# Patient Record
Sex: Female | Born: 1985 | Race: Black or African American | Hispanic: No | Marital: Single | Smoking: Current every day smoker
Health system: Southern US, Community
[De-identification: ages and names within clinical notes are randomized; demographics above are authoritative.]

## PROBLEM LIST (undated history)

## (undated) DIAGNOSIS — O24419 Gestational diabetes mellitus in pregnancy, unspecified control: Secondary | ICD-10-CM

## (undated) HISTORY — PX: NO PAST SURGERIES: SHX2092

## (undated) HISTORY — DX: Gestational diabetes mellitus in pregnancy, unspecified control: O24.419

---
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
## 2003-03-07 ENCOUNTER — Emergency Department (HOSPITAL_COMMUNITY): Admission: EM | Admit: 2003-03-07 | Discharge: 2003-03-07 | Payer: Self-pay | Admitting: Emergency Medicine

## 2009-07-28 ENCOUNTER — Emergency Department (HOSPITAL_COMMUNITY): Admission: EM | Admit: 2009-07-28 | Discharge: 2009-07-28 | Payer: Self-pay | Admitting: Emergency Medicine

## 2017-03-02 ENCOUNTER — Emergency Department (HOSPITAL_COMMUNITY)
Admission: EM | Admit: 2017-03-02 | Discharge: 2017-03-02 | Disposition: A | Discharge status: Home / Self Care | Payer: Self-pay | Attending: Emergency Medicine | Admitting: Emergency Medicine

## 2017-03-02 ENCOUNTER — Encounter: Payer: Self-pay | Admitting: Emergency Medicine

## 2017-03-02 ENCOUNTER — Emergency Department (HOSPITAL_COMMUNITY): Payer: Self-pay

## 2017-03-02 DIAGNOSIS — S92255A Nondisplaced fracture of navicular [scaphoid] of left foot, initial encounter for closed fracture: Secondary | ICD-10-CM | POA: Insufficient documentation

## 2017-03-02 DIAGNOSIS — W19XXXA Unspecified fall, initial encounter: Secondary | ICD-10-CM

## 2017-03-02 DIAGNOSIS — Y999 Unspecified external cause status: Secondary | ICD-10-CM | POA: Insufficient documentation

## 2017-03-02 DIAGNOSIS — Y92007 Garden or yard of unspecified non-institutional (private) residence as the place of occurrence of the external cause: Secondary | ICD-10-CM | POA: Insufficient documentation

## 2017-03-02 DIAGNOSIS — Y92009 Unspecified place in unspecified non-institutional (private) residence as the place of occurrence of the external cause: Secondary | ICD-10-CM

## 2017-03-02 DIAGNOSIS — Y939 Activity, unspecified: Secondary | ICD-10-CM | POA: Insufficient documentation

## 2017-03-02 DIAGNOSIS — W1789XA Other fall from one level to another, initial encounter: Secondary | ICD-10-CM | POA: Insufficient documentation

## 2017-03-02 LAB — I-STAT CHEM 8, ED
BUN: 8 mg/dL (ref 6–20)
CHLORIDE: 104 mmol/L (ref 101–111)
CREATININE: 0.9 mg/dL (ref 0.44–1.00)
Calcium, Ion: 1.25 mmol/L (ref 1.15–1.40)
Glucose, Bld: 119 mg/dL — ABNORMAL HIGH (ref 65–99)
HCT: 42 % (ref 36.0–46.0)
Hemoglobin: 14.3 g/dL (ref 12.0–15.0)
POTASSIUM: 3.5 mmol/L (ref 3.5–5.1)
Sodium: 141 mmol/L (ref 135–145)
TCO2: 25 mmol/L (ref 22–32)

## 2017-03-02 LAB — I-STAT BETA HCG BLOOD, ED (MC, WL, AP ONLY): I-stat hCG, quantitative: <5 m[IU]/mL (ref <5)

## 2017-03-02 MED ORDER — SIMETHICONE 40 MG/0.6ML PO SUSP
ORAL | Status: AC
  Filled 2017-03-02: qty 30

## 2017-03-02 MED ORDER — HYDROCODONE-ACETAMINOPHEN 5-325 MG PO TABS: 1.0000 | ORAL_TABLET | Freq: Four times a day (QID) | ORAL | 0 refills | Status: DC | PRN

## 2017-03-02 MED ORDER — KETOROLAC TROMETHAMINE 30 MG/ML IJ SOLN
30.0000 mg | Freq: Once | INTRAMUSCULAR | Status: AC
  Administered 2017-03-02: 30 mg via INTRAMUSCULAR
  Filled 2017-03-02: qty 1

## 2017-03-02 MED ORDER — NAPROXEN 250 MG PO TABS: ORAL_TABLET | ORAL | 0 refills | Status: DC

## 2017-03-02 NOTE — Discharge Instructions (Signed)
Elevate your foot. Use ice for comfort and to keep the swelling down. DO NOT PUT ANY WEIGHT ON YOUR LEFT FOOT. Use the crutches when you need to walk. Leave the splint on until you are rechecked by the orthopedist on call, Dr Romeo AppleHarrison. Call his office to make an appointment. Take the medications as prescribed.

## 2017-03-02 NOTE — ED Triage Notes (Signed)
Pt feel off front step and twist ankle about 9pm.

## 2017-03-02 NOTE — ED Provider Notes (Signed)
[facility]EM[facility]Y DEPT Provider Note   CSN: [id number] Arrival date & time: 03/02/17  0146  Time seen 02:05 AM   History   Chief Complaint Chief Complaint  Patient presents with  . Foot Injury    Lt    HPI Vickie Moss is a [age] y.o. female.  HPI  patient states she's having remodeling done at her house and the railing has been taken off her deck. She states tonight about 9 PM she fell off the side of the deck about a foot and a half. She does not know how she landed. But states she had pain in her left foot/ankle. She states she was able to ambulate with pain however she was awakened from sleep with worsening pain this morning. She denies any knee pain. She denies any numbness or tingling in her toes. She denies any other injury.  PCP .none  No past medical history on file.  There are no active problems to display for this patient.   No past surgical history on file.  OB History    No data available       Home Medications    Prior to Admission medications   Medication Sig Start Date End Date Taking? Authorizing Provider  HYDROcodone-acetaminophen (NORCO/VICODIN) 5-325 MG tablet Take 1 tablet by mouth every 6 (six) hours as needed for severe pain. 03/02/17   Devoria AlbeKnapp, Deontra Pereyra, MD  naproxen (NAPROSYN) 250 MG tablet Take 1 po BID with food prn pain 03/02/17   Devoria AlbeKnapp, Lisandro Meggett, MD    Family History No family history on file.  Social History Social History  Substance Use Topics  . Smoking status: Not on file  . Smokeless tobacco: Not on file  . Alcohol use Not on file     Allergies   Patient has no allergy information on record.   Review of Systems Review of Systems  All other systems reviewed and are negative.    Physical Exam Updated Vital Signs BP (!) 130/93 (BP Location: Left Arm)   Pulse 78   Temp 98.9 F (37.2 C) (Oral)   Resp 20   Wt 72.1 kg (159 lb)   LMP 02/28/2017   SpO2 99%   Vital signs normal    Physical Exam  Constitutional: She is oriented to  person, place, and time. She appears well-developed and well-nourished. She appears distressed.  HENT:  Head: Normocephalic and atraumatic.  Right Ear: External ear normal.  Left Ear: External ear normal.  Eyes: Conjunctivae and EOM are normal.  Neck: Normal range of motion.  Cardiovascular: Normal rate.   Pulmonary/Chest: Effort normal. No respiratory distress.  Musculoskeletal: She exhibits edema and tenderness.       Feet:  Patient's left knee is nontender to palpation, she has no tenderness to palpate the tibia or fibula until I get to the level of her ankle. She doesn't appear to have pain when I pressed rectally on the malleoli however when I pressed near inferior and anterior to the medial malleolus that seems to be where her pain is located. There is mild swelling in the area. She has good distal pulses and capillary refill.  Neurological: She is alert and oriented to person, place, and time. No cranial nerve deficit.  Skin: Skin is warm and dry. No rash noted. No erythema. No pallor.  Psychiatric: Her behavior is normal. Thought content normal. Her mood appears anxious.  Nursing note and vitals reviewed.    ED Treatments / Results  Labs (all labs ordered are  listed, but only abnormal results are displayed) Results for orders placed or performed during the hospital encounter of 03/02/17  I-stat Chem 8, ED  Result Value Ref Range   Sodium 141 135 - 145 mmol/L   Potassium 3.5 3.5 - 5.1 mmol/L   Chloride 104 101 - 111 mmol/L   BUN 8 6 - 20 mg/dL   Creatinine, Ser 5.78 0.44 - 1.00 mg/dL   Glucose, Bld 469 (H) 65 - 99 mg/dL   Calcium, Ion 6.29 5.28 - 1.40 mmol/L   TCO2 25 22 - 32 mmol/L   Hemoglobin 14.3 12.0 - 15.0 g/dL   HCT 41.3 24.4 - 01.0 %  I-Stat beta hCG blood, ED  Result Value Ref Range   I-stat hCG, quantitative <5.0 <5 mIU/mL   Comment 3           Laboratory interpretation all normal     EKG  EKG Interpretation None       Radiology Dg Ankle Complete  Left  Result Date: 03/02/2017 CLINICAL DATA:  Larey Seat off of front step and twisted the ankle about 9 p.m. EXAM: LEFT FOOT - COMPLETE 3+ VIEW; LEFT ANKLE COMPLETE - 3+ VIEW COMPARISON:  None. FINDINGS: Comminuted fractures of the lateral aspect of the navicular bone with overlying soft tissue swelling. Left foot and ankle appear otherwise intact. No additional fractures are demonstrated. No dislocation. No radiopaque soft tissue foreign bodies. No destructive or expansile bone lesions. IMPRESSION: Comminuted fractures of the lateral aspect of the navicular bone with overlying soft tissue swelling. Left foot and ankle appear otherwise intact. Electronically Signed   By: Burman Nieves M.D.   On: 03/02/2017 03:15   Dg Foot Complete Left  Result Date: 03/02/2017 CLINICAL DATA:  Larey Seat off of front step and twisted the ankle about 9 p.m. EXAM: LEFT FOOT - COMPLETE 3+ VIEW; LEFT ANKLE COMPLETE - 3+ VIEW COMPARISON:  None. FINDINGS: Comminuted fractures of the lateral aspect of the navicular bone with overlying soft tissue swelling. Left foot and ankle appear otherwise intact. No additional fractures are demonstrated. No dislocation. No radiopaque soft tissue foreign bodies. No destructive or expansile bone lesions. IMPRESSION: Comminuted fractures of the lateral aspect of the navicular bone with overlying soft tissue swelling. Left foot and ankle appear otherwise intact. Electronically Signed   By: Burman Nieves M.D.   On: 03/02/2017 03:15    Procedures Procedures (including critical care time)  Medications Ordered in ED Medications  simethicone (MYLICON) 40 MG/0.6ML suspension (not administered)  ketorolac (TORADOL) 30 MG/ML injection 30 mg (30 mg Intramuscular Given 03/02/17 0230)     Initial Impression / Assessment and Plan / ED Course  I have reviewed the triage vital signs and the nursing notes.  Pertinent labs & imaging results that were available during my care of the patient were reviewed by  me and considered in my medical decision making (see chart for details).     Patient was given Toradol IM for pain. X-rays were ordered. Review of her prior testing shows she has no prior laboratory results in our system. I-STAT 8 was ordered as was a i-STAT beta hCG.  After reviewing patient's x-ray she was placed in a posterior splint and crutches. We looked at her x-ray and discussed her fracture.. I stressed the need to follow-up with orthopedist to make sure this heals well. She was advised that she would need to be nonweightbearing for at least 6 weeks.   Review of the [place] shows no entries  Final Clinical Impressions(s) / ED Diagnoses   Final diagnoses:  Closed nondisplaced fracture of navicular bone of left foot, initial encounter  Fall in home, initial encounter    New Prescriptions New Prescriptions   HYDROCODONE-ACETAMINOPHEN (NORCO/VICODIN) 5-325 MG TABLET    Take 1 tablet by mouth every 6 (six) hours as needed for severe pain.   NAPROXEN (NAPROSYN) 250 MG TABLET    Take 1 po BID with food prn pain    Plan discharge  Devoria Albe, MD, Concha Pyo, MD 03/02/17 [phone]

## 2017-03-03 ENCOUNTER — Encounter: Payer: Self-pay | Admitting: Orthopedic Surgery

## 2017-03-03 ENCOUNTER — Ambulatory Visit (INDEPENDENT_AMBULATORY_CARE_PROVIDER_SITE_OTHER): Payer: Self-pay | Admitting: Orthopedic Surgery

## 2017-03-03 VITALS — BP 125/87 | HR 117

## 2017-03-03 DIAGNOSIS — S92255A Nondisplaced fracture of navicular [scaphoid] of left foot, initial encounter for closed fracture: Secondary | ICD-10-CM

## 2017-03-03 NOTE — Progress Notes (Signed)
Patient ID: Vickie Moss, female   DOB: [birth year], 31 y.o.   MRN: [id number]  Chief Complaint  Patient presents with  . Foot Injury    left foot fracture, DOI 03/02/17    HPI Vickie Moss is a 31 y.o. female.  She was having some work done on her porch she came out to talk to the people and fell and injured her talus. The x-ray was read as lateral navicular  fracture and it's actually a medial navicular nondisplaced fracture. She complains of mild to moderate dull aching pain medial side of her foot with swelling  Review of Systems Review of Systems  Respiratory: Negative.   Cardiovascular: Negative.   Musculoskeletal: Positive for gait problem.  Neurological: Negative for numbness.   (2 MINIMUM)  Medical history negative for hypertension diabetes  No past surgical history on file.  Social History Social History  Substance Use Topics  . Smoking status: Current Every Day Smoker  . Smokeless tobacco: Never Used  . Alcohol use No    Not on File  No outpatient prescriptions have been marked as taking for the 03/03/17 encounter (Office Visit) with Vickki HearingHarrison, Mikeisha Lemonds E, MD.      Physical Exam Physical Exam 1.BP 125/87   Pulse (!) 117   LMP 02/28/2017   2. Gen. appearance. The patient is well-developed and well-nourished, grooming and hygiene are normal. There are no gross congenital abnormalities  3. The patient is alert and oriented to person place and time  4. Mood and affect are normal  5. Ambulation crutches needed   Examination reveals the following: 6. On inspection we find primarily tenderness and swelling medial sided ankle over the navicular  7. With the range of motion of  right ankle is normal  8. Stability tests were normal  anterior drawer  9. Strength tests revealed grade 5 motor strength  10. Skin we find no rash ulceration or erythema  11. Sensation remains intact  12 peripheral pulses and capillary refill intact    MEDICAL DECISION  MAKING:    Data Reviewed Plain films ankle and foot administered at the hospital the ankle is normal, the talus fracture is medial not lateral  Assessment Encounter Diagnosis  Name Primary?  . Closed nondisplaced fracture of navicular bone of left foot, initial encounter Yes    Plan Nonweightbearing for 2 weeks and then weightbearing as tolerated in a [place] until symptomatically healed  Recommend x-ray 4 weeks  Out of work next 4 weeks  [employer] Container[employer]ley Irva Loser 03/03/2017, 4:24 PM

## 2017-03-03 NOTE — Patient Instructions (Signed)
Out of work note for 4 weeks or a graft no weight on the foot for the first 2 weeks and then weightbearing as tolerated in a Cam Walker

## 2017-03-31 ENCOUNTER — Encounter: Payer: Self-pay | Admitting: Orthopedic Surgery

## 2017-03-31 ENCOUNTER — Other Ambulatory Visit: Payer: Self-pay

## 2017-08-07 ENCOUNTER — Encounter (HOSPITAL_COMMUNITY): Payer: Self-pay | Admitting: Emergency Medicine

## 2017-08-07 ENCOUNTER — Other Ambulatory Visit: Payer: Self-pay

## 2017-08-07 ENCOUNTER — Emergency Department (HOSPITAL_COMMUNITY)
Admission: EM | Admit: 2017-08-07 | Discharge: 2017-08-07 | Disposition: A | Discharge status: Home / Self Care | Payer: Self-pay | Attending: Emergency Medicine | Admitting: Emergency Medicine

## 2017-08-07 DIAGNOSIS — R21 Rash and other nonspecific skin eruption: Secondary | ICD-10-CM | POA: Insufficient documentation

## 2017-08-07 DIAGNOSIS — F17210 Nicotine dependence, cigarettes, uncomplicated: Secondary | ICD-10-CM | POA: Insufficient documentation

## 2017-08-07 MED ORDER — FAMOTIDINE 20 MG PO TABS
20.0000 mg | ORAL_TABLET | Freq: Once | ORAL | Status: AC
  Administered 2017-08-07: 20 mg via ORAL
  Filled 2017-08-07: qty 1

## 2017-08-07 MED ORDER — HYDROXYZINE HCL 25 MG PO TABS
25.0000 mg | ORAL_TABLET | Freq: Once | ORAL | Status: AC
Start: 2017-08-07 — End: 2017-08-07
  Administered 2017-08-07: 25 mg via ORAL
  Filled 2017-08-07: qty 1

## 2017-08-07 MED ORDER — DEXAMETHASONE SODIUM PHOSPHATE 10 MG/ML IJ SOLN
10.0000 mg | Freq: Once | INTRAMUSCULAR | Status: AC
  Administered 2017-08-07: 10 mg via INTRAMUSCULAR
  Filled 2017-08-07: qty 1

## 2017-08-07 NOTE — ED Triage Notes (Signed)
Pt reports reaction started this morning, noted to have large swollen and reddened areas to bilateral hips. Does state she switched laundry detergent last week but has worn things that had been washed before today. Denies throat swelling or difficulty breathing.

## 2017-08-07 NOTE — Discharge Instructions (Signed)
Please monitor closely the soaps that you use, the dryer sheets that you use.  Please monitor closely your cushion seats and your mattresses for any mites or insects.  Use Claritin each morning for itching.  Use Benadryl at bedtime.  Please return to the emergency department if any changes in your condition, difficulty with breathing, unusual swelling, changes or concerns.

## 2017-08-07 NOTE — ED Provider Notes (Signed)
[facility] Provider Note   CSN: [id number] Arrival date & time: 08/07/17  1514     History   Chief Complaint Chief Complaint  Patient presents with  . Allergic Reaction    HPI Vickie Moss is a 32 y.o. female.  Patient is a 32 year old female who presents to the emergency department with a complaint of rash/swollen areas on her hips.  Patient states that she has been in her usual state of good health until today when she noticed a large red raised area on her right and left hip bone area, one on the back of her leg, and one on the upper portion of her left shoulder.  The patient states she has not been on any new medications, there is been no new foods.  The patient denies being on any new furniture or different furniture.  She states that she has used some different laundry detergent approximately a week ago, but she is worn things that have been washed in this material 1 or 2 days before today.  She denies any swelling of her face or difficulty with breathing or swallowing.  No other issues noted.  She presents now for assistance with this problem.   The history is provided by the patient.  Allergic Reaction  Presenting symptoms: rash   Presenting symptoms: no wheezing     History reviewed. No pertinent past medical history.  There are no active problems to display for this patient.   History reviewed. No pertinent surgical history.  OB History    No data available       Home Medications    Prior to Admission medications   Medication Sig Start Date End Date Taking? Authorizing Provider  HYDROcodone-acetaminophen (NORCO/VICODIN) 5-325 MG tablet Take 1 tablet by mouth every 6 (six) hours as needed for severe pain. 03/02/17   Devoria AlbeKnapp, Iva, MD  naproxen (NAPROSYN) 250 MG tablet Take 1 po BID with food prn pain 03/02/17   Devoria AlbeKnapp, Iva, MD    Family History History reviewed. No pertinent family history.  Social History Social History   Tobacco  Use  . Smoking status: Current Every Day Smoker    Packs/day: 1.00  . Smokeless tobacco: Never Used  Substance Use Topics  . Alcohol use: Yes  . Drug use: Yes    Types: Cocaine    Comment: 08/06/2017     Allergies   Patient has no known allergies.   Review of Systems Review of Systems  Constitutional: Negative for activity change.       All ROS Neg except as noted in HPI  HENT: Negative for nosebleeds.   Eyes: Negative for photophobia and discharge.  Respiratory: Negative for cough, shortness of breath and wheezing.   Cardiovascular: Negative for chest pain and palpitations.  Gastrointestinal: Negative for abdominal pain and blood in stool.  Genitourinary: Negative for dysuria, frequency and hematuria.  Musculoskeletal: Negative for arthralgias, back pain and neck pain.  Skin: Positive for rash.  Neurological: Negative for dizziness, seizures and speech difficulty.  Psychiatric/Behavioral: Negative for confusion and hallucinations.     Physical Exam Updated Vital Signs BP (!) 161/89 (BP Location: Right Arm)   Pulse 93   Temp 97.8 F (36.6 C) (Oral)   Resp 18   Ht 5' 1.5" (1.562 m)   Wt 72.1 kg (159 lb)   LMP 08/02/2017   SpO2 100%   BMI 29.56 kg/m   Physical Exam  Constitutional: She is oriented to person, place, and time.  She appears well-developed and well-nourished.  Non-toxic appearance.  HENT:  Head: Normocephalic.  Right Ear: Tympanic membrane and external ear normal.  Left Ear: Tympanic membrane and external ear normal.  No facial swelling noted.  The airway is patent.  Speech is understandable.  Eyes: EOM and lids are normal. Pupils are equal, round, and reactive to light.  Neck: Normal range of motion. Neck supple. Carotid bruit is not present.  Cardiovascular: Normal rate, regular rhythm, normal heart sounds, intact distal pulses and normal pulses.  Pulmonary/Chest: Breath sounds normal. No respiratory distress.  Patient speaks in complete sentences  without problem.  There is symmetrical rise and fall of the chest.  No wheezes appreciated.  Abdominal: Soft. Bowel sounds are normal. There is no tenderness. There is no guarding.  Musculoskeletal: Normal range of motion.  Lymphadenopathy:       Head (right side): No submandibular adenopathy present.       Head (left side): No submandibular adenopathy present.    She has no cervical adenopathy.  Neurological: She is alert and oriented to person, place, and time. She has normal strength. No cranial nerve deficit or sensory deficit.  Skin: Skin is warm and dry.  Small hive noted at the left upper shoulder.  Red raised hive area on the pubic bone area right and left.  Psychiatric: She has a normal mood and affect. Her speech is normal.  Nursing note and vitals reviewed.    ED Treatments / Results  Labs (all labs ordered are listed, but only abnormal results are displayed) Labs Reviewed - No data to display  EKG  EKG Interpretation None       Radiology No results found.  Procedures Procedures (including critical care time)  Medications Ordered in ED Medications - No data to display   Initial Impression / Assessment and Plan / ED Course  I have reviewed the triage vital signs and the nursing notes.  Pertinent labs & imaging results that were available during my care of the patient were reviewed by me and considered in my medical decision making (see chart for details).       Final Clinical Impressions(s) / ED Diagnoses MDM  Patient presents to the emergency department with a hive type areas on her upper shoulder area, both hip areas, and one on the back of the left thigh.  There is been no recent changes in medicine or foods.  The patient is not seen anything crawling or biting her.  She states she has not been on any new furniture or different furniture.  She has used some different detergent, but has been wearing closed during the week that is been washed in this  particular detergent with no reaction until now.  I have asked the patient to jot down anything new that she may be eating or drinking.  To monitor her cushion seats as well as her mattresses closely for any bugs or insects of any kind.  Patient will be treated with Claritin during the day, and Benadryl at bedtime.  Patient was given an injection of Decadron here in the emergency department.  She is to return immediately if any changes in her condition, problems, or concerns.  Patient is in agreement with this plan.   Final diagnoses:  Rash    ED Discharge Orders    None       Ivery Quale, PA-C 08/07/17 1802    Derwood Kaplan, MD 08/08/17 [phone]

## 2018-02-12 IMAGING — DX DG ANKLE COMPLETE 3+V*L*
3 series · 3 of 3 positions shown · non-contrast
Comparison: None.

CLINICAL DATA: Fell off of front step and twisted the ankle about 9
p.m..

EXAM:
LEFT FOOT - COMPLETE 3+ VIEW; LEFT ANKLE COMPLETE - 3+ VIEW

[ankle ap]
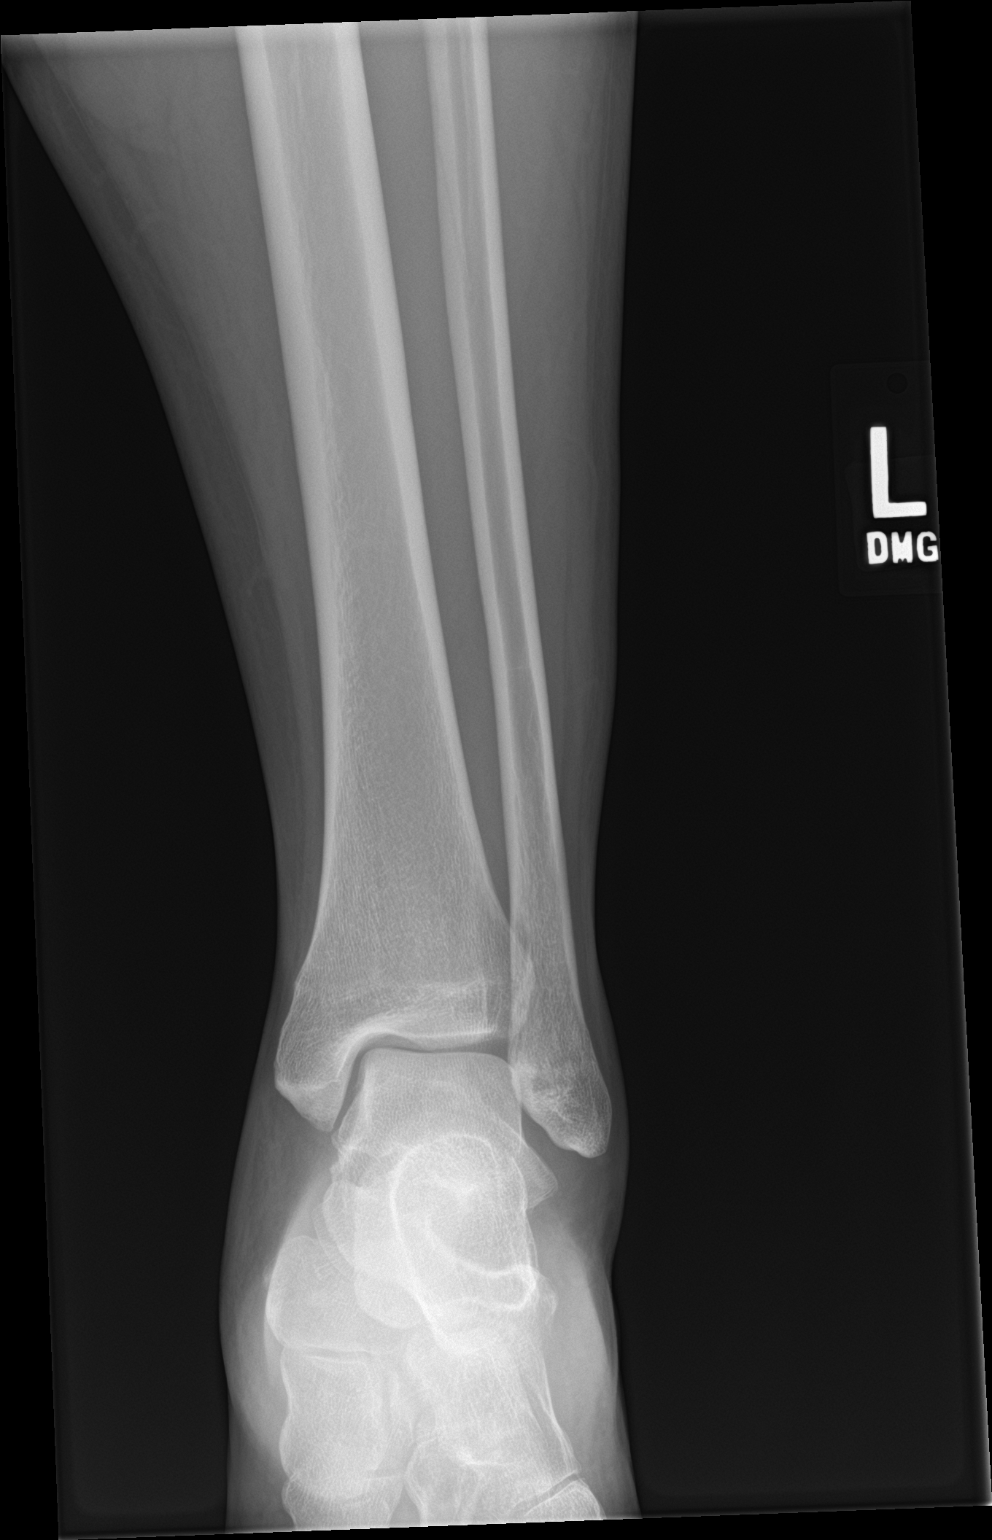

[ankle obl]
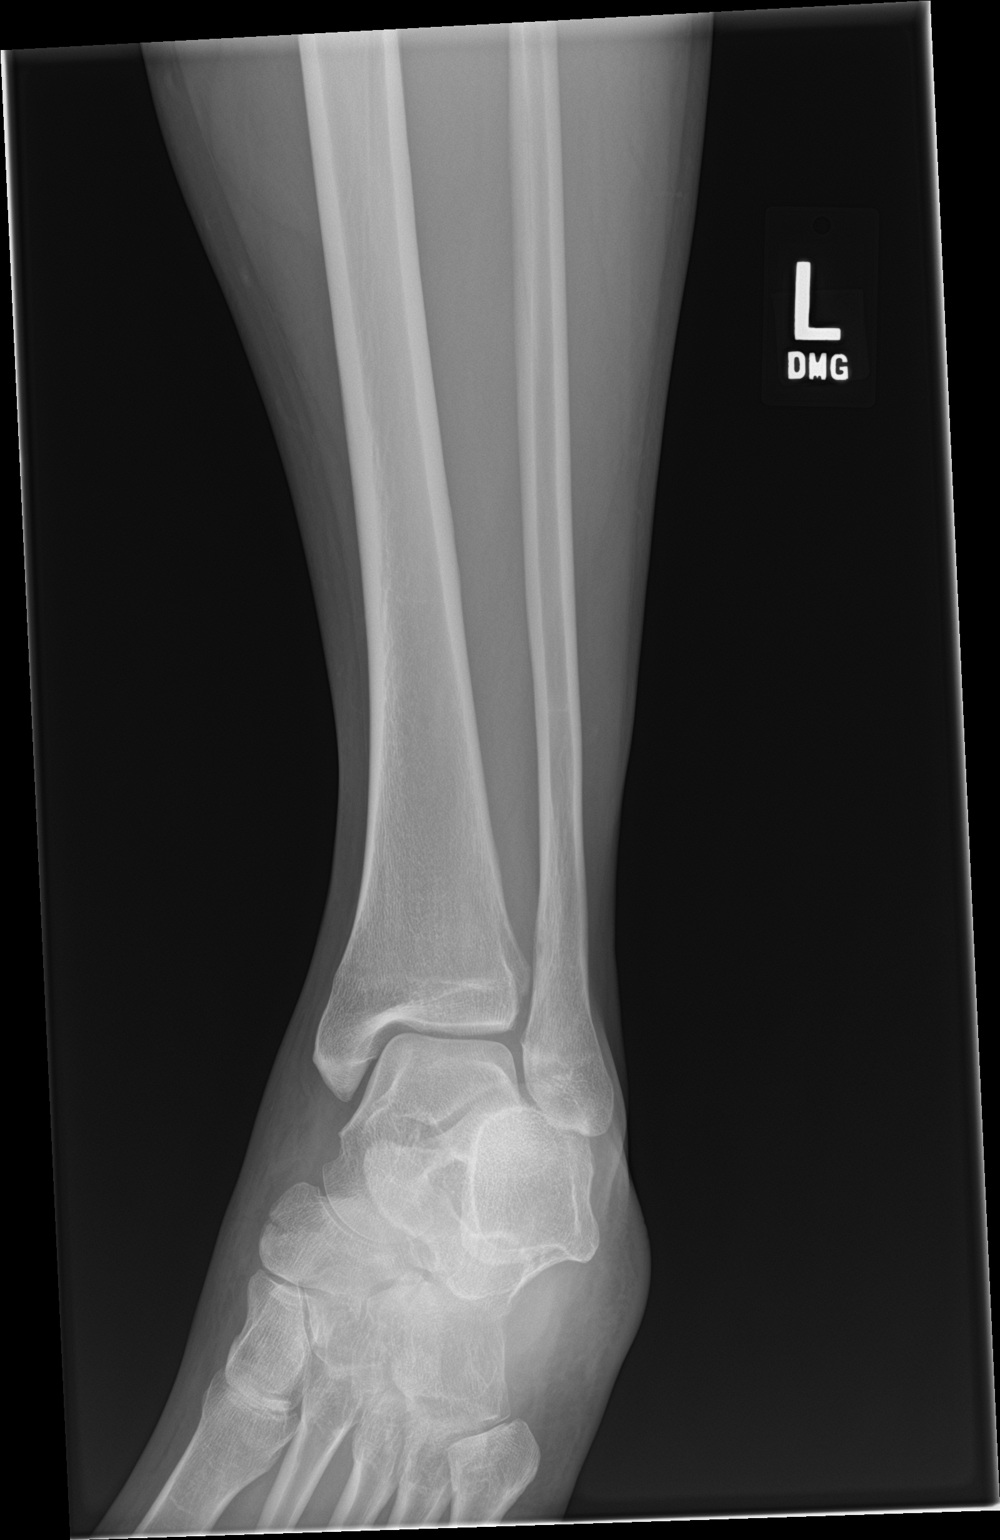

[ankle lat]
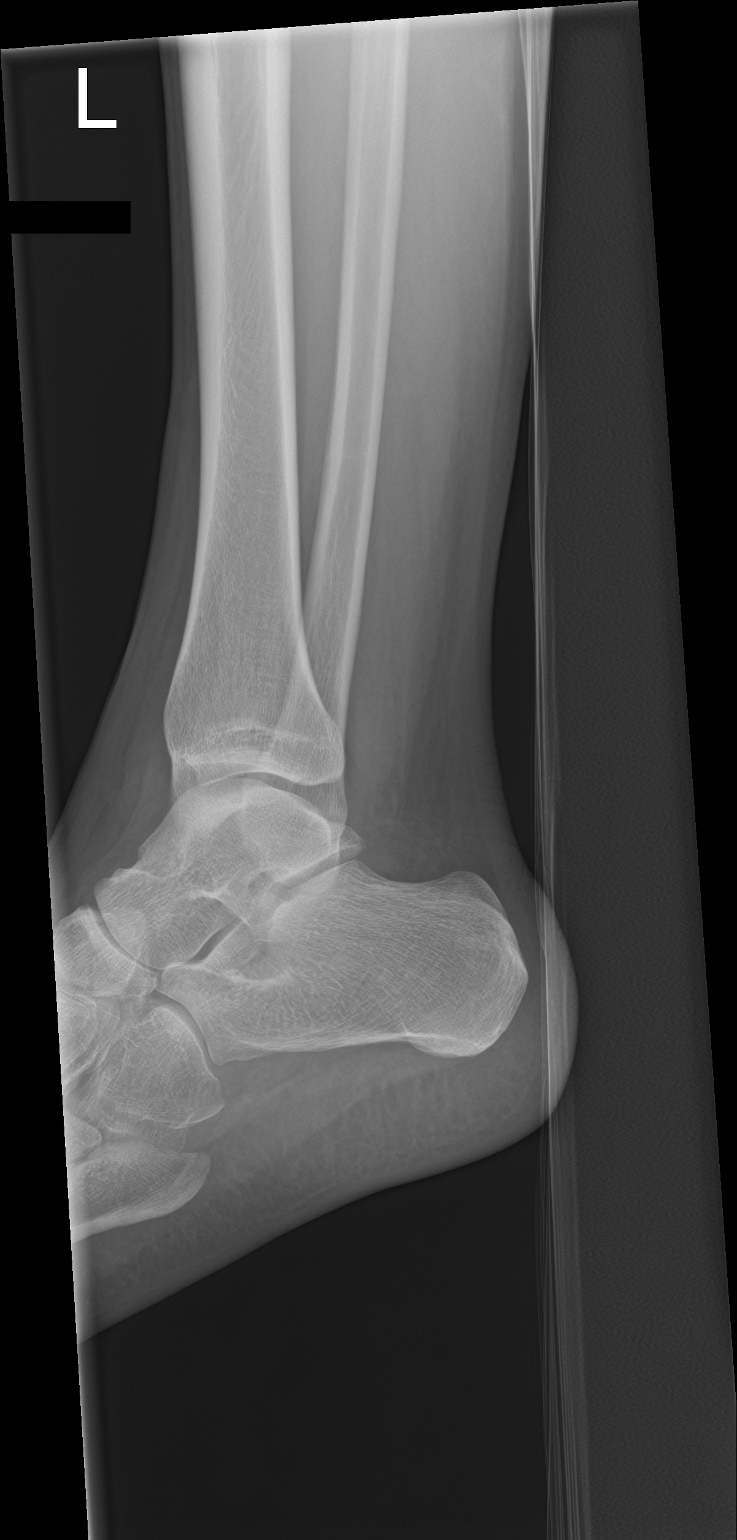

[3 of 3 positions shown; findings below may reference images not displayed]

FINDINGS: Comminuted fractures of the lateral aspect of the navicular bone
with overlying soft tissue swelling. Left foot and ankle appear
otherwise intact. No additional fractures are demonstrated. No
dislocation. No radiopaque soft tissue foreign bodies. No
destructive or expansile bone lesions.
IMPRESSION: Comminuted fractures of the lateral aspect of the navicular bone
with overlying soft tissue swelling. Left foot and ankle appear
otherwise intact.

## 2018-02-12 IMAGING — DX DG FOOT COMPLETE 3+V*L*
3 series · 3 of 3 positions shown · non-contrast
Comparison: None.

CLINICAL DATA: Fell off of front step and twisted the ankle about 9
p.m..

EXAM:
LEFT FOOT - COMPLETE 3+ VIEW; LEFT ANKLE COMPLETE - 3+ VIEW

[foot ap]
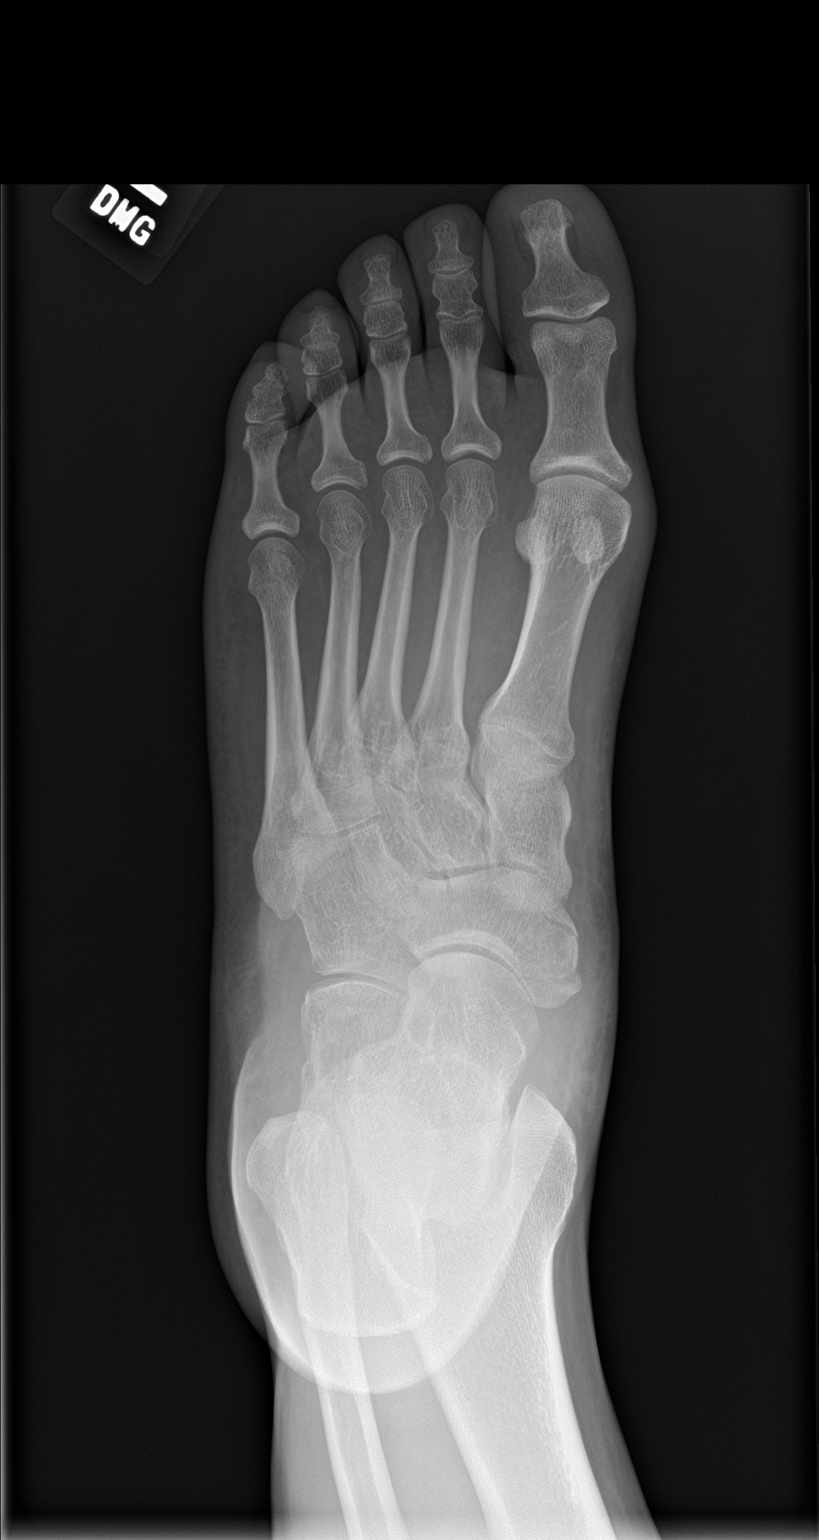

[foot obl]
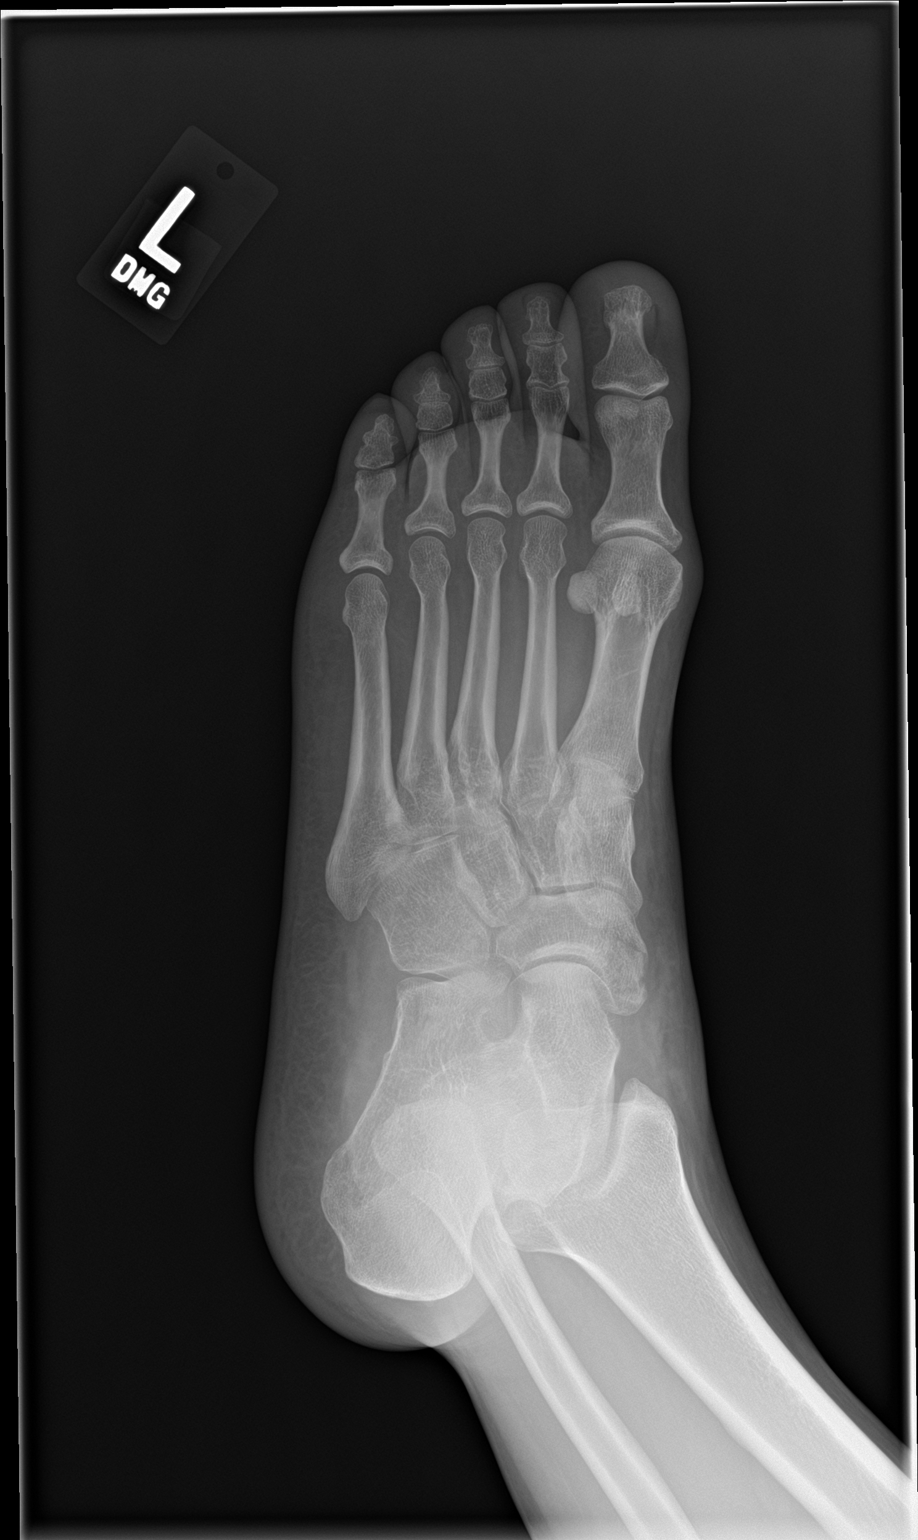

[foot lat]
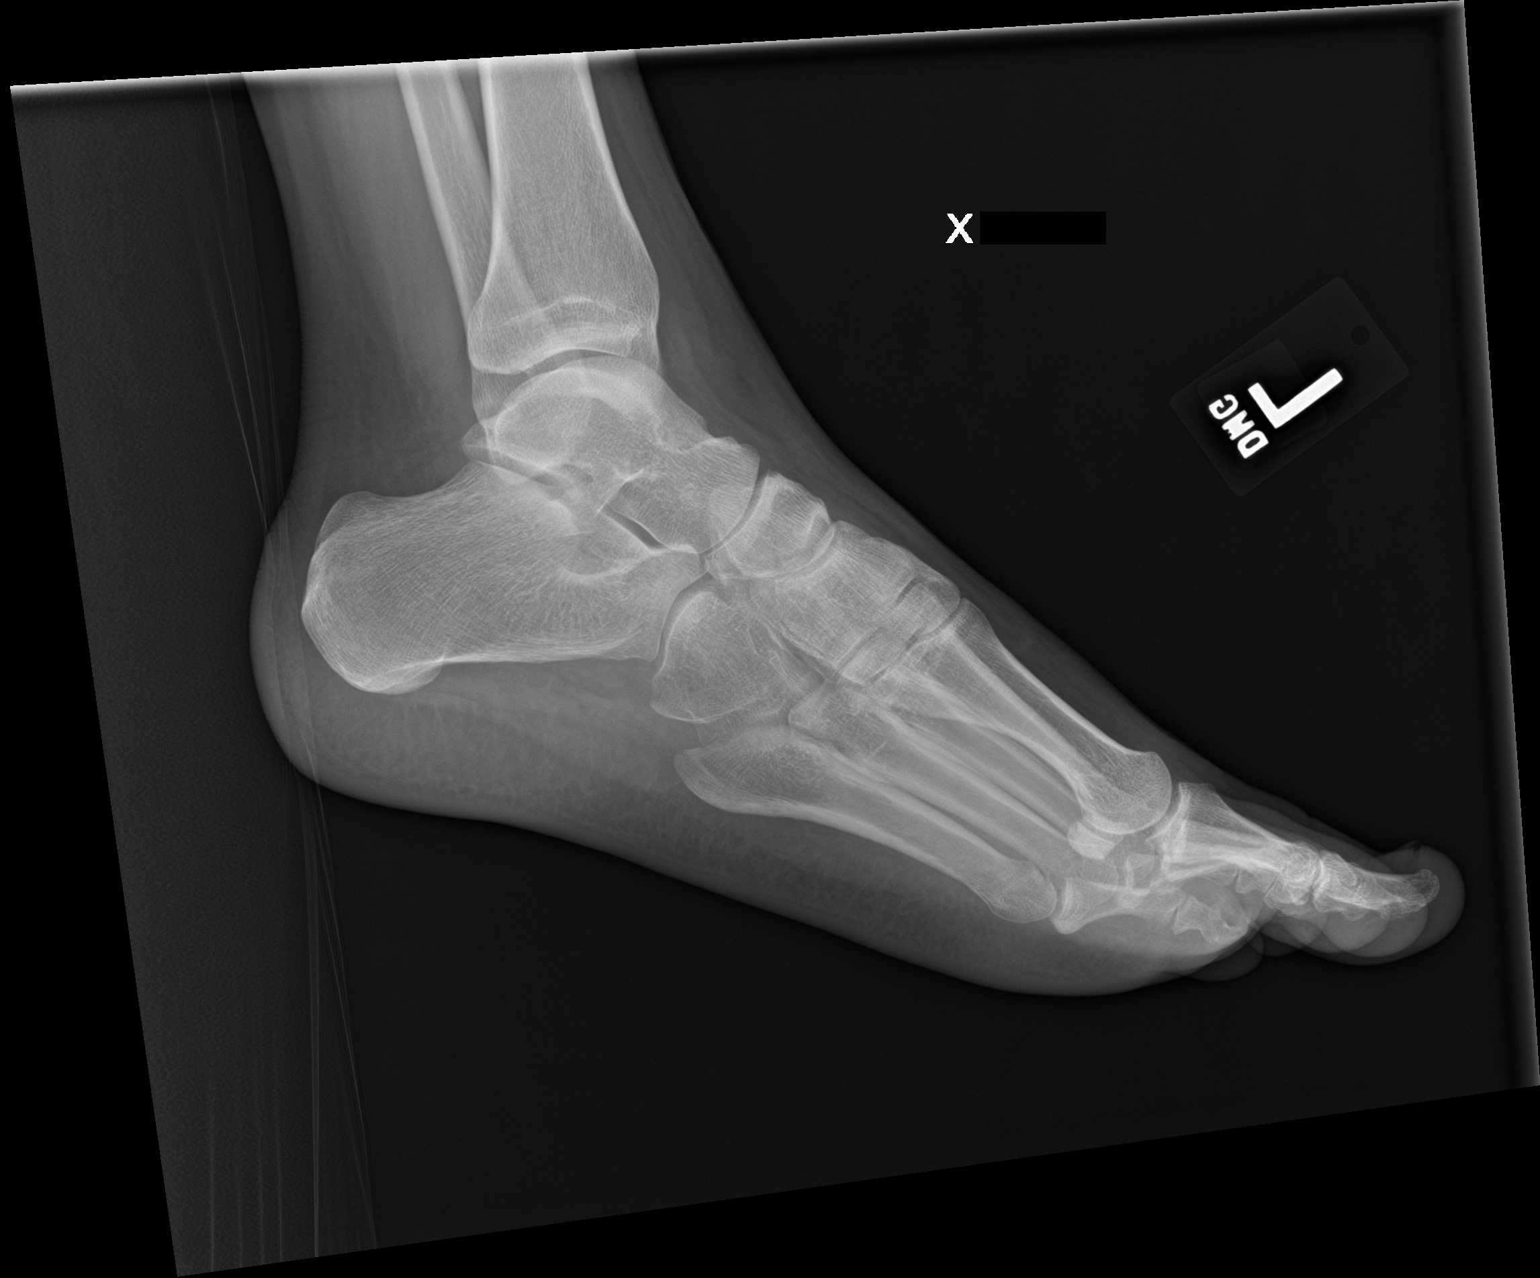

[3 of 3 positions shown; findings below may reference images not displayed]

FINDINGS: Comminuted fractures of the lateral aspect of the navicular bone
with overlying soft tissue swelling. Left foot and ankle appear
otherwise intact. No additional fractures are demonstrated. No
dislocation. No radiopaque soft tissue foreign bodies. No
destructive or expansile bone lesions.
IMPRESSION: Comminuted fractures of the lateral aspect of the navicular bone
with overlying soft tissue swelling. Left foot and ankle appear
otherwise intact.

## 2018-06-22 NOTE — L&D Delivery Note (Signed)
Delivery Note At 2:35 PM a viable female was delivered via Vaginal, Spontaneous (Presentation: Left OA).  APGAR: 9, 9; weight 7 lb 0.7 oz (3195 g).   Placenta status: Spontaneous, Intact.  Cord: 3 vessels  Anesthesia: Epidural Episiotomy: None Lacerations: 2nd degree Suture Repair: 3.0 vicryl Est. Blood Loss (mL): 182  Mom to postpartum.  Baby to Couplet care / Skin to Skin.  Wende Mott CNM 06/04/2019, 3:38 PM

## 2018-11-09 ENCOUNTER — Encounter (HOSPITAL_COMMUNITY): Payer: Self-pay | Admitting: Emergency Medicine

## 2018-11-09 ENCOUNTER — Other Ambulatory Visit: Payer: Self-pay

## 2018-11-09 ENCOUNTER — Emergency Department (HOSPITAL_COMMUNITY): Payer: Medicaid Other

## 2018-11-09 ENCOUNTER — Emergency Department (HOSPITAL_COMMUNITY)
Admission: EM | Admit: 2018-11-09 | Discharge: 2018-11-09 | Disposition: A | Discharge status: Home / Self Care | Payer: Medicaid Other | Attending: Emergency Medicine | Admitting: Emergency Medicine

## 2018-11-09 DIAGNOSIS — N898 Other specified noninflammatory disorders of vagina: Secondary | ICD-10-CM | POA: Insufficient documentation

## 2018-11-09 DIAGNOSIS — O23599 Infection of other part of genital tract in pregnancy, unspecified trimester: Secondary | ICD-10-CM | POA: Diagnosis not present

## 2018-11-09 DIAGNOSIS — Z3A10 10 weeks gestation of pregnancy: Secondary | ICD-10-CM | POA: Diagnosis not present

## 2018-11-09 DIAGNOSIS — F17210 Nicotine dependence, cigarettes, uncomplicated: Secondary | ICD-10-CM | POA: Insufficient documentation

## 2018-11-09 DIAGNOSIS — A5901 Trichomonal vulvovaginitis: Secondary | ICD-10-CM

## 2018-11-09 DIAGNOSIS — O209 Hemorrhage in early pregnancy, unspecified: Secondary | ICD-10-CM

## 2018-11-09 DIAGNOSIS — B9689 Other specified bacterial agents as the cause of diseases classified elsewhere: Secondary | ICD-10-CM

## 2018-11-09 DIAGNOSIS — O98319 Other infections with a predominantly sexual mode of transmission complicating pregnancy, unspecified trimester: Secondary | ICD-10-CM | POA: Diagnosis not present

## 2018-11-09 DIAGNOSIS — O23591 Infection of other part of genital tract in pregnancy, first trimester: Secondary | ICD-10-CM | POA: Diagnosis not present

## 2018-11-09 DIAGNOSIS — O99331 Smoking (tobacco) complicating pregnancy, first trimester: Secondary | ICD-10-CM | POA: Insufficient documentation

## 2018-11-09 DIAGNOSIS — N939 Abnormal uterine and vaginal bleeding, unspecified: Secondary | ICD-10-CM | POA: Diagnosis not present

## 2018-11-09 LAB — URINALYSIS, ROUTINE W REFLEX MICROSCOPIC
Bacteria, UA: NONE SEEN
Bilirubin Urine: NEGATIVE
Glucose, UA: NEGATIVE mg/dL
Ketones, ur: NEGATIVE mg/dL
Nitrite: NEGATIVE
Protein, ur: NEGATIVE mg/dL
Specific Gravity, Urine: 1.003 — ABNORMAL LOW (ref 1.005–1.030)
pH: 7 (ref 5.0–8.0)

## 2018-11-09 LAB — CBC WITH DIFFERENTIAL/PLATELET
Abs Immature Granulocytes: 0.01 10*3/uL (ref 0.00–0.07)
Basophils Absolute: 0 10*3/uL (ref 0.0–0.1)
Basophils Relative: 0 %
Eosinophils Absolute: 0.2 10*3/uL (ref 0.0–0.5)
Eosinophils Relative: 2 %
HCT: 39.1 % (ref 36.0–46.0)
Hemoglobin: 13.3 g/dL (ref 12.0–15.0)
Immature Granulocytes: 0 %
Lymphocytes Relative: 32 %
Lymphs Abs: 2.6 10*3/uL (ref 0.7–4.0)
MCH: 33.1 pg (ref 26.0–34.0)
MCHC: 34 g/dL (ref 30.0–36.0)
MCV: 97.3 fL (ref 80.0–100.0)
Monocytes Absolute: 0.5 10*3/uL (ref 0.1–1.0)
Monocytes Relative: 7 %
Neutro Abs: 4.8 10*3/uL (ref 1.7–7.7)
Neutrophils Relative %: 59 %
Platelets: 222 10*3/uL (ref 150–400)
RBC: 4.02 MIL/uL (ref 3.87–5.11)
RDW: 12.2 % (ref 11.5–15.5)
WBC: 8.2 10*3/uL (ref 4.0–10.5)
nRBC: 0 % (ref 0.0–0.2)

## 2018-11-09 LAB — BASIC METABOLIC PANEL
Anion gap: 5 (ref 5–15)
BUN: 7 mg/dL (ref 6–20)
CO2: 25 mmol/L (ref 22–32)
Calcium: 9 mg/dL (ref 8.9–10.3)
Chloride: 111 mmol/L (ref 98–111)
Creatinine, Ser: 0.55 mg/dL (ref 0.44–1.00)
GFR calc Af Amer: >60 mL/min (ref >60)
GFR calc non Af Amer: >60 mL/min (ref >60)
Glucose, Bld: 81 mg/dL (ref 70–99)
Potassium: 4.1 mmol/L (ref 3.5–5.1)
Sodium: 141 mmol/L (ref 135–145)

## 2018-11-09 LAB — WET PREP, GENITAL
Sperm: NONE SEEN
Yeast Wet Prep HPF POC: NONE SEEN

## 2018-11-09 LAB — HCG, QUANTITATIVE, PREGNANCY: hCG, Beta Chain, Quant, S: 98233 m[IU]/mL — ABNORMAL HIGH (ref <5)

## 2018-11-09 LAB — I-STAT BETA HCG BLOOD, ED (MC, WL, AP ONLY): I-stat hCG, quantitative: >2000 m[IU]/mL — ABNORMAL HIGH (ref <5)

## 2018-11-09 LAB — PREGNANCY, URINE: Preg Test, Ur: POSITIVE — AB

## 2018-11-09 LAB — ABO/RH: ABO/RH(D): A POS

## 2018-11-09 MED ORDER — METRONIDAZOLE 500 MG PO TABS: 500.0000 mg | ORAL_TABLET | Freq: Two times a day (BID) | ORAL | 0 refills | Status: DC

## 2018-11-09 MED ORDER — PRENATAL COMPLETE 14-0.4 MG PO TABS: 1.0000 | ORAL_TABLET | Freq: Every day | ORAL | 0 refills | Status: AC

## 2018-11-09 NOTE — Discharge Instructions (Signed)
Your ultrasound today showed a single intrauterine pregnancy appoximately 10 weeks and 10 days old.  Avoid strenuous activity and do NOT place anything into your vagina, ie: no douching, no tampons, no sexual intercourse, no swimming or tub baths, until you are seen in follow up by your regular OB/GYN doctor.  Take the prescriptions as directed. Call the OB/GYN doctor today to schedule a follow up appointment within the next week.  Return to the Emergency Department immediately if worsening.

## 2018-11-09 NOTE — ED Triage Notes (Signed)
Pt C/O vaginal discharge "before going to bed last night." Pt stating the bleeding "is not there anymore." Pt states she has had 2 positive pregnancy tests. Reports last period was "sometime in March."

## 2018-11-09 NOTE — ED Provider Notes (Signed)
[facility] EMERGENCY DEPARTMENT Provider Note   CSN: [id number] Arrival date & time: 11/09/18  4098    History   Chief Complaint Chief Complaint  Patient presents with   Vaginal Discharge    HPI Vickie Moss is a 33 y.o. female.     HPI  Pt was seen at 0725. Per pt, c/o gradual onset and resolution of one episode of "vaginal spotting" last evening. States this has resolved this morning. Pt describes the spotting as pink blood on the toilet paper when she wipes after urinating. States her LMP was "sometime in March" and "maybe the 15th." States she has taken 2 positive home pregnancy tests. Pt does not have OB/GYN MD. Hx G1P0, LMP approx 09/04/18, EGA 9 3/7 weeks. Denies cough, fever, known COVID+ exposure. Denies vaginal discharge, no pelvic/abd pain, no back pain, no N/V/D, no rash, no dysuria/hematuria.    History reviewed. No pertinent past medical history.  There are no active problems to display for this patient.   History reviewed. No pertinent surgical history.   OB History   No obstetric history on file.      Home Medications    Prior to Admission medications   Medication Sig Start Date End Date Taking? Authorizing Provider  HYDROcodone-acetaminophen (NORCO/VICODIN) 5-325 MG tablet Take 1 tablet by mouth every 6 (six) hours as needed for severe pain. 03/02/17   Devoria Albe, MD  naproxen (NAPROSYN) 250 MG tablet Take 1 po BID with food prn pain 03/02/17   Devoria Albe, MD    Family History No family history on file.  Social History Social History   Tobacco Use   Smoking status: Current Every Day Smoker    Packs/day: 1.00   Smokeless tobacco: Never Used  Substance Use Topics   Alcohol use: Yes   Drug use: Yes    Types: Cocaine    Comment: 08/06/2017     Allergies   Patient has no known allergies.   Review of Systems Review of Systems ROS: Statement: All systems negative except as marked or noted in the HPI; Constitutional: Negative for  fever and chills. ; ; Eyes: Negative for eye pain, redness and discharge. ; ; ENMT: Negative for ear pain, hoarseness, nasal congestion, sinus pressure and sore throat. ; ; Cardiovascular: Negative for chest pain, palpitations, diaphoresis, dyspnea and peripheral edema. ; ; Respiratory: Negative for cough, wheezing and stridor. ; ; Gastrointestinal: Negative for nausea, vomiting, diarrhea, abdominal pain, blood in stool, hematemesis, jaundice and rectal bleeding. . ; ; Genitourinary: Negative for dysuria, flank pain and hematuria. ; ; GYN:  No pelvic pain, +vaginal spotting (resolved), no vaginal discharge, no vulvar pain. ;; Musculoskeletal: Negative for back pain and neck pain. Negative for swelling and trauma.; ; Skin: Negative for pruritus, rash, abrasions, blisters, bruising and skin lesion.; ; Neuro: Negative for headache, lightheadedness and neck stiffness. Negative for weakness, altered level of consciousness, altered mental status, extremity weakness, paresthesias, involuntary movement, seizure and syncope.       Physical Exam Updated Vital Signs BP (!) 126/91    Pulse (!) 110    Temp 98.1 F (36.7 C) (Oral)    Resp 17    Ht  (1.549 m)    Wt 59 kg    LMP 09/06/2018    SpO2 98%    BMI 24.56 kg/m   Physical Exam 0730: Physical examination:  Nursing notes reviewed; Vital signs and O2 SAT reviewed;  Constitutional: Well developed, Well nourished, Well hydrated, In no  acute distress; Head:  Normocephalic, atraumatic; Eyes: EOMI, PERRL, No scleral icterus; ENMT: Mouth and pharynx normal, Mucous membranes moist; Neck: Supple, Full range of motion, No lymphadenopathy; Cardiovascular: Regular rate and rhythm, No gallop; Respiratory: Breath sounds clear & equal bilaterally, No wheezes.  Speaking full sentences with ease, Normal respiratory effort/excursion; Chest: Nontender, Movement normal; Abdomen: Soft, Nontender, Nondistended, Normal bowel sounds; Genitourinary: No CVA tenderness. Pelvic exam  performed with permission of pt and female ED tech assist during exam.  External genitalia w/o lesions. Vaginal vault with scant white discharge, no blood in vault, no bleeding from os.  Cervix w/o lesions, not friable, GC/chlam and wet prep obtained and sent to lab.  Bimanual exam w/o CMT, uterine or adnexal tenderness.;;; Extremities: Peripheral pulses normal, No tenderness, No edema, No calf edema or asymmetry.; Neuro: AA&Ox3, Major CN grossly intact.  Speech clear. No gross focal motor or sensory deficits in extremities.; Skin: Color normal, Warm, Dry.   ED Treatments / Results  Labs (all labs ordered are listed, but only abnormal results are displayed)   EKG None  Radiology   Procedures Procedures (including critical care time)  Medications Ordered in ED Medications - No data to display   Initial Impression / Assessment and Plan / ED Course  I have reviewed the triage vital signs and the nursing notes.  Pertinent labs & imaging results that were available during my care of the patient were reviewed by me and considered in my medical decision making (see chart for details).     MDM Reviewed: previous chart, nursing note and vitals Reviewed previous: labs Interpretation: labs and ultrasound    Results for orders placed or performed during the hospital encounter of 11/09/18  Wet prep, genital  Result Value Ref Range   Yeast Wet Prep HPF POC NONE SEEN NONE SEEN   Trich, Wet Prep PRESENT (A) NONE SEEN   Clue Cells Wet Prep HPF POC PRESENT (A) NONE SEEN   WBC, Wet Prep HPF POC FEW (A) NONE SEEN   Sperm NONE SEEN   CBC with Differential/Platelet  Result Value Ref Range   WBC 8.2 4.0 - 10.5 K/uL   RBC 4.02 3.87 - 5.11 MIL/uL   Hemoglobin 13.3 12.0 - 15.0 g/dL   HCT 16.1 09.6 - 04.5 %   MCV 97.3 80.0 - 100.0 fL   MCH 33.1 26.0 - 34.0 pg   MCHC 34.0 30.0 - 36.0 g/dL   RDW 40.9 81.1 - 91.4 %   Platelets 222 150 - 400 K/uL   nRBC 0.0 0.0 - 0.2 %   Neutrophils Relative  % 59 %   Neutro Abs 4.8 1.7 - 7.7 K/uL   Lymphocytes Relative 32 %   Lymphs Abs 2.6 0.7 - 4.0 K/uL   Monocytes Relative 7 %   Monocytes Absolute 0.5 0.1 - 1.0 K/uL   Eosinophils Relative 2 %   Eosinophils Absolute 0.2 0.0 - 0.5 K/uL   Basophils Relative 0 %   Basophils Absolute 0.0 0.0 - 0.1 K/uL   Immature Granulocytes 0 %   Abs Immature Granulocytes 0.01 0.00 - 0.07 K/uL  Basic metabolic panel  Result Value Ref Range   Sodium 141 135 - 145 mmol/L   Potassium 4.1 3.5 - 5.1 mmol/L   Chloride 111 98 - 111 mmol/L   CO2 25 22 - 32 mmol/L   Glucose, Bld 81 70 - 99 mg/dL   BUN 7 6 - 20 mg/dL   Creatinine, Ser 7.82 0.44 - 1.00 mg/dL  Calcium 9.0 8.9 - 10.3 mg/dL   GFR calc non Af Amer >60 >60 mL/min   GFR calc Af Amer >60 >60 mL/min   Anion gap 5 5 - 15  Pregnancy, urine  Result Value Ref Range   Preg Test, Ur POSITIVE (A) NEGATIVE  Urinalysis, Routine w reflex microscopic  Result Value Ref Range   Color, Urine STRAW (A) YELLOW   APPearance CLEAR CLEAR   Specific Gravity, Urine 1.003 (L) 1.005 - 1.030   pH 7.0 5.0 - 8.0   Glucose, UA NEGATIVE NEGATIVE mg/dL   Hgb urine dipstick SMALL (A) NEGATIVE   Bilirubin Urine NEGATIVE NEGATIVE   Ketones, ur NEGATIVE NEGATIVE mg/dL   Protein, ur NEGATIVE NEGATIVE mg/dL   Nitrite NEGATIVE NEGATIVE   Leukocytes,Ua TRACE (A) NEGATIVE   RBC / HPF 0-5 0 - 5 RBC/hpf   WBC, UA 0-5 0 - 5 WBC/hpf   Bacteria, UA NONE SEEN NONE SEEN   Squamous Epithelial / LPF 0-5 0 - 5  hCG, quantitative, pregnancy  Result Value Ref Range   hCG, Beta Chain, Quant, S 98,233 (H) <5 mIU/mL  I-Stat Beta hCG blood, ED ([facility], [facility], [facility] only)  Result Value Ref Range   I-stat hCG, quantitative >2,000.0 (H) <5 mIU/mL   Comment 3          ABO/Rh  Result Value Ref Range   ABO/RH(D)      A POS Performed at [facility], [address], [place], [place] [address]    US Ob Comp Less 14 Wks Result Date: 11/09/2018 CLINICAL DATA:  Vaginal bleeding EXAM: OBSTETRIC <14 WK  ULTRASOUND TECHNIQUE: Transabdominal ultrasound was performed for evaluation of the gestation as well as the maternal uterus and adnexal regions. COMPARISON:  None. FINDINGS: Intrauterine gestational sac: Visualized Yolk sac:  Visualized Embryo:  Visualized Cardiac Activity: Visualized Heart Rate: 178 bpm CRL:   35 mm   10 w 2 d                  [place] EDC: June 05, 2019 Subchorionic hemorrhage:  None visualized. Maternal uterus/adnexae: Right ovary measures 3.3 x 2.2 x 2.0 cm. Left ovary measures 4.1 x 2.8 x 1.9 cm. Small corpus luteum on the right. No other extrauterine pelvic or adnexal mass. No free pelvic fluid. IMPRESSION: Single live intrauterine gestation with estimated gestational age of 10+ weeks. No subchorionic hemorrhage. Study otherwise unremarkable. Electronically Signed   By: Bretta Bang III M.D.   On: 11/09/2018 08:54     ADIYA LIVIGNI was evaluated in Emergency Department on 11/09/2018 for the symptoms described in the history of present illness. She was evaluated in the context of the global COVID-19 pandemic, which necessitated consideration that the patient might be at risk for infection with the SARS-CoV-2 virus that causes COVID-19. Institutional protocols and algorithms that pertain to the evaluation of patients at risk for COVID-19 are in a state of rapid change based on information released by regulatory bodies including the CDC and federal and state organizations. These policies and algorithms were followed during the patient's care in the ED.    0940:  Will tx for BV and Trich. Pt will need f/u with OB/GYN MD. Dx and testing, d/w pt.  Questions answered.  Verb understanding, agreeable to d/c home with outpt f/u.    Final Clinical Impressions(s) / ED Diagnoses   Final diagnoses:  None    ED Discharge Orders    None       Clarene Duke,  Nicholos JohnsKathleen, DO 11/12/18 1635

## 2018-11-10 LAB — GC/CHLAMYDIA PROBE AMP (~~LOC~~) NOT AT ARMC
Chlamydia: NEGATIVE
Neisseria Gonorrhea: NEGATIVE

## 2018-11-18 ENCOUNTER — Other Ambulatory Visit: Payer: Self-pay | Admitting: Obstetrics and Gynecology

## 2018-11-18 DIAGNOSIS — Z3682 Encounter for antenatal screening for nuchal translucency: Secondary | ICD-10-CM

## 2018-11-21 ENCOUNTER — Telehealth: Payer: Self-pay | Admitting: *Deleted

## 2018-11-21 NOTE — Telephone Encounter (Signed)
Patient informed we are still not allowing any visitors or children to come in during appointment time unless physical assistance is needed. Asked if has had any exposure to anyone suspected or confirmed of having COVID-19 or if she was experiencing any of the following, to reschedule: fever, cough, shortness of breath, muscle pain, diarrhea, Vickie Moss, vomiting, abdominal pain, red eye, weakness, bruising, bleeding, joint pain, or a severe headache.  Stated no to all.  Asked that she complete E-check-in via mychart prior to arrival.  Advised to check-in via Hello [employer] and call our office on arrival in our office parking lot to complete registration over the phone. Advised to also use the provided hand sanitizer when entering the office and to wear a mask if they have one of their own, if not, we are happy to provide one. Pt verbalized understanding.      

## 2018-11-22 ENCOUNTER — Encounter (HOSPITAL_COMMUNITY): Payer: Self-pay | Admitting: *Deleted

## 2018-11-22 ENCOUNTER — Other Ambulatory Visit: Payer: Self-pay

## 2018-11-22 ENCOUNTER — Ambulatory Visit (INDEPENDENT_AMBULATORY_CARE_PROVIDER_SITE_OTHER): Payer: Medicaid Other | Admitting: Women's Health

## 2018-11-22 ENCOUNTER — Ambulatory Visit: Payer: Medicaid Other | Admitting: *Deleted

## 2018-11-22 ENCOUNTER — Ambulatory Visit (INDEPENDENT_AMBULATORY_CARE_PROVIDER_SITE_OTHER): Payer: Medicaid Other

## 2018-11-22 ENCOUNTER — Emergency Department (HOSPITAL_COMMUNITY)
Admission: EM | Admit: 2018-11-22 | Discharge: 2018-11-22 | Disposition: A | Discharge status: Home / Self Care | Payer: Medicaid Other | Attending: Emergency Medicine | Admitting: Emergency Medicine

## 2018-11-22 ENCOUNTER — Encounter: Payer: Self-pay | Admitting: Women's Health

## 2018-11-22 VITALS — BP 131/83 | HR 93 | Wt 130.0 lb

## 2018-11-22 DIAGNOSIS — O99331 Smoking (tobacco) complicating pregnancy, first trimester: Secondary | ICD-10-CM | POA: Diagnosis not present

## 2018-11-22 DIAGNOSIS — Z3401 Encounter for supervision of normal first pregnancy, first trimester: Secondary | ICD-10-CM | POA: Diagnosis not present

## 2018-11-22 DIAGNOSIS — Z3A12 12 weeks gestation of pregnancy: Secondary | ICD-10-CM

## 2018-11-22 DIAGNOSIS — L509 Urticaria, unspecified: Secondary | ICD-10-CM | POA: Diagnosis not present

## 2018-11-22 DIAGNOSIS — F17200 Nicotine dependence, unspecified, uncomplicated: Secondary | ICD-10-CM

## 2018-11-22 DIAGNOSIS — W010XXA Fall on same level from slipping, tripping and stumbling without subsequent striking against object, initial encounter: Secondary | ICD-10-CM | POA: Diagnosis not present

## 2018-11-22 DIAGNOSIS — W19XXXA Unspecified fall, initial encounter: Secondary | ICD-10-CM

## 2018-11-22 DIAGNOSIS — Z363 Encounter for antenatal screening for malformations: Secondary | ICD-10-CM

## 2018-11-22 DIAGNOSIS — Z331 Pregnant state, incidental: Secondary | ICD-10-CM

## 2018-11-22 DIAGNOSIS — Z1379 Encounter for other screening for genetic and chromosomal anomalies: Secondary | ICD-10-CM

## 2018-11-22 DIAGNOSIS — Z1389 Encounter for screening for other disorder: Secondary | ICD-10-CM

## 2018-11-22 DIAGNOSIS — Z79899 Other long term (current) drug therapy: Secondary | ICD-10-CM | POA: Insufficient documentation

## 2018-11-22 DIAGNOSIS — O99711 Diseases of the skin and subcutaneous tissue complicating pregnancy, first trimester: Secondary | ICD-10-CM | POA: Insufficient documentation

## 2018-11-22 DIAGNOSIS — Z3682 Encounter for antenatal screening for nuchal translucency: Secondary | ICD-10-CM

## 2018-11-22 DIAGNOSIS — O0990 Supervision of high risk pregnancy, unspecified, unspecified trimester: Secondary | ICD-10-CM | POA: Insufficient documentation

## 2018-11-22 MED ORDER — DIPHENHYDRAMINE HCL 25 MG PO CAPS
25.0000 mg | ORAL_CAPSULE | Freq: Once | ORAL | Status: AC
  Administered 2018-11-22: 25 mg via ORAL
  Filled 2018-11-22: qty 1

## 2018-11-22 MED ORDER — PREDNISONE 10 MG PO TABS: 20.0000 mg | ORAL_TABLET | Freq: Two times a day (BID) | ORAL | 0 refills | Status: DC

## 2018-11-22 MED ORDER — DOXYLAMINE-PYRIDOXINE 10-10 MG PO TBEC: DELAYED_RELEASE_TABLET | ORAL | 6 refills | Status: DC

## 2018-11-22 MED ORDER — PREDNISONE 20 MG PO TABS
20.0000 mg | ORAL_TABLET | Freq: Once | ORAL | Status: AC
  Administered 2018-11-22: 20 mg via ORAL
  Filled 2018-11-22: qty 1

## 2018-11-22 MED ORDER — BLOOD PRESSURE MONITOR MISC: 0 refills | Status: AC

## 2018-11-22 NOTE — Progress Notes (Signed)
INITIAL OBSTETRICAL VISIT Patient name: Vickie Moss MRN [id number]  Date of birth: [birth year] Chief Complaint:   Initial Prenatal Visit (nt/it)  History of Present Illness:   Vickie Moss is a 33 y.o. G1P0 African American female at 4086w1d by 10wk u/s, with an Estimated Date of Delivery: 06/05/19 being seen today for her initial obstetrical visit.   Her obstetrical history is significant for primigravida.   Today she reports some n/v, requests meds. Smoker: 1ppd prior to pregnancy, now only 1-2/day and trying to quit.  Patient's last menstrual period was 09/06/2018. [facility]. Results were: normal Review of Systems:   Pertinent items are noted in HPI Denies cramping/contractions, leakage of fluid, vaginal bleeding, abnormal vaginal discharge w/ itching/odor/irritation, headaches, visual changes, shortness of breath, chest pain, abdominal pain, severe nausea/vomiting, or problems with urination or bowel movements unless otherwise stated above.  Pertinent History Reviewed:  Reviewed past medical,surgical, social, obstetrical and family history.  Reviewed problem list, medications and allergies. OB History  Gravida Para Term Preterm AB Living  1            SAB TAB Ectopic Multiple Live Births               # Outcome Date GA Lbr Len/2nd Weight Sex Delivery Anes PTL Lv  1 Current            Physical Assessment:   Vitals:   11/22/18 1201  BP: 131/83  Pulse: 93  Weight: 130 lb (59 kg)  Body mass index is 24.56 kg/m.       Physical Examination:  General appearance - well appearing, and in no distress  Mental status - alert, oriented to person, place, and time  Psych:  She has a normal mood and affect  Skin - warm and dry, normal color, no suspicious lesions noted  Chest - effort normal, all lung fields clear to auscultation bilaterally  Heart - normal rate and regular rhythm  Abdomen - soft, nontender  Extremities:  No swelling or varicosities noted  Thin  prep pap is not done  TODAY'S NT US 12+1 wks,measurements c/w dates,normal ovaries bilat,NB present,NT 2 mm,crl 59.38 mm,fhr 171 bpm,fundal placenta  No results found for this or any previous visit (from the past 24 hour(s)).  Assessment & Plan:  1) Low-Risk Pregnancy G1P0 at 7086w1d with an Estimated Date of Delivery: 06/05/19   2) Initial OB visit  3) N/V> rx diclegis  Meds:  Meds ordered this encounter  Medications  . Blood Pressure Monitor MISC    Sig: For regular home bp monitoring during pregnancy    Dispense:  1 each    Refill:  0    Dx: z34.0    Order Specific Question:   Supervising Provider    Answer:   Despina HiddenEURE, LUTHER H [[id number]]  . Doxylamine-Pyridoxine (DICLEGIS) 10-10 MG TBEC    Sig: 2 tabs q hs, if sx persist add 1 tab q am on day 3, if sx persist add 1 tab q afternoon on day 4    Dispense:  100 tablet    Refill:  6    Order Specific Question:   Supervising Provider    Answer:   Lazaro ArmsEURE, LUTHER H [[id number]]    Initial labs obtained Continue prenatal vitamins Reviewed n/v relief measures and warning s/s to report Reviewed recommended weight gain based on pre-gravid BMI Encouraged well-balanced diet Genetic Screening discussed: requested nt/it, maternit21 Cystic fibrosis, SMA, Fragile X screening discussed declined  Ultrasound discussed; fetal survey: requested CCNC completed>PCM not here, form faxed Does not home bp cuff. Rx faxed to [facility]. Check bp weekly, let us know if >140/90.   Follow-up: Return in about 6 weeks (around 01/03/2019) for LROB, 2nd IT, [id number].   Orders Placed This Encounter  Procedures  . GC/Chlamydia Probe Amp  . Urine Culture  . US OB Comp + 14 Wk  . Obstetric Panel, Including HIV  . Sickle cell screen  . Urinalysis, Routine w reflex microscopic  . Integrated 1  . MaterniT21 PLUS Core  . POC Urinalysis Dipstick OB    Cheral Marker CNM, [facility] 11/22/2018 2:07 PM

## 2018-11-22 NOTE — Discharge Instructions (Addendum)
Begin taking prednisone as prescribed.  Benadryl 25 mg every 6 hours as needed for itching.  Return to the emergency department for difficulty breathing, severe abdominal pain, or other new and concerning symptoms.

## 2018-11-22 NOTE — ED Triage Notes (Signed)
Pt states she started to break out in hives yesterday afternoon, pt has hives to her back and abdomen; pt states tonight while she was getting out of the shower pt fell landing on her left side; pt states she is [redacted] weeks pregnant and wants to make sure the baby is ok

## 2018-11-22 NOTE — Patient Instructions (Signed)
Julious Oka, I greatly value your feedback.  If you receive a survey following your visit with [place] today, we appreciate you taking the time to fill it out.  Thanks, Joellyn Haff, CNM, [facility]  [facility] HOSPITAL HAS MOVED!!! It is now [facility] at [facility] ([address] [place], [place] [address]) Entrance located off of [address] Free 24/7 valet parking    Nausea & Vomiting  Have saltine crackers or pretzels by your bed and eat a few bites before you raise your head out of bed in the morning  Eat small frequent meals throughout the day instead of large meals  Drink plenty of fluids throughout the day to stay hydrated, just don't drink a lot of fluids with your meals.  This can make your stomach fill up faster making you feel sick  Do not brush your teeth right after you eat  Products with real ginger are good for nausea, like ginger ale and ginger hard candy Make sure it says made with real ginger!  Sucking on sour candy like lemon heads is also good for nausea  If your prenatal vitamins make you nauseated, take them at night so you will sleep through the nausea  Sea Bands  If you feel like you need medicine for the nausea & vomiting please let us know  If you are unable to keep any fluids or food down please let us know   Constipation  Drink plenty of fluid, preferably water, throughout the day  Eat foods high in fiber such as fruits, vegetables, and grains  Exercise, such as walking, is a good way to keep your bowels regular  Drink warm fluids, especially warm prune juice, or decaf coffee  Eat a 1/2 cup of real oatmeal (not instant), 1/2 cup applesauce, and 1/2-1 cup warm prune juice every day  If needed, you may take Colace (docusate sodium) stool softener once or twice a day to help keep the stool soft. If you are pregnant, wait until you are out of your first trimester (12-14 weeks of pregnancy)  If you still are having problems with  constipation, you may take Miralax once daily as needed to help keep your bowels regular.  If you are pregnant, wait until you are out of your first trimester (12-14 weeks of pregnancy)   First Trimester of Pregnancy The first trimester of pregnancy is from week 1 until the end of week 12 (months 1 through 3). A week after a sperm fertilizes an egg, the egg will implant on the wall of the uterus. This embryo will begin to develop into a baby. Genes from you and your partner are forming the baby. The female genes determine whether the baby is a boy or a girl. At 6-8 weeks, the eyes and face are formed, and the heartbeat can be seen on ultrasound. At the end of 12 weeks, all the baby's organs are formed.  Now that you are pregnant, you will want to do everything you can to have a healthy baby. Two of the most important things are to get good prenatal care and to follow your health care provider's instructions. Prenatal care is all the medical care you receive before the baby's birth. This care will help prevent, find, and treat any problems during the pregnancy and childbirth. BODY CHANGES Your body goes through many changes during pregnancy. The changes vary from woman to woman.   You may gain or lose a couple of pounds at  first.  You may feel sick to your stomach (nauseous) and throw up (vomit). If the vomiting is uncontrollable, call your health care provider.  You may tire easily.  You may develop headaches that can be relieved by medicines approved by your health care provider.  You may urinate more often. Painful urination may mean you have a bladder infection.  You may develop heartburn as a result of your pregnancy.  You may develop constipation because certain hormones are causing the muscles that push waste through your intestines to slow down.  You may develop hemorrhoids or swollen, bulging veins (varicose veins).  Your breasts may begin to grow larger and become tender. Your nipples  may stick out more, and the tissue that surrounds them (areola) may become darker.  Your gums may bleed and may be sensitive to brushing and flossing.  Dark spots or blotches (chloasma, mask of pregnancy) may develop on your face. This will likely fade after the baby is born.  Your menstrual periods will stop.  You may have a loss of appetite.  You may develop cravings for certain kinds of food.  You may have changes in your emotions from day to day, such as being excited to be pregnant or being concerned that something may go wrong with the pregnancy and baby.  You may have more vivid and strange dreams.  You may have changes in your hair. These can include thickening of your hair, rapid growth, and changes in texture. Some women also have hair loss during or after pregnancy, or hair that feels dry or thin. Your hair will most likely return to normal after your baby is born. WHAT TO EXPECT AT YOUR PRENATAL VISITS During a routine prenatal visit:  You will be weighed to make sure you and the baby are growing normally.  Your blood pressure will be taken.  Your abdomen will be measured to track your baby's growth.  The fetal heartbeat will be listened to starting around week 10 or 12 of your pregnancy.  Test results from any previous visits will be discussed. Your health care provider may ask you:  How you are feeling.  If you are feeling the baby move.  If you have had any abnormal symptoms, such as leaking fluid, bleeding, severe headaches, or abdominal cramping.  If you have any questions. Other tests that may be performed during your first trimester include:  Blood tests to find your blood type and to check for the presence of any previous infections. They will also be used to check for low iron levels (anemia) and Rh antibodies. Later in the pregnancy, blood tests for diabetes will be done along with other tests if problems develop.  Urine tests to check for infections,  diabetes, or protein in the urine.  An ultrasound to confirm the proper growth and development of the baby.  An amniocentesis to check for possible genetic problems.  Fetal screens for spina bifida and Down syndrome.  You may need other tests to make sure you and the baby are doing well. HOME CARE INSTRUCTIONS  Medicines  Follow your health care provider's instructions regarding medicine use. Specific medicines may be either safe or unsafe to take during pregnancy.  Take your prenatal vitamins as directed.  If you develop constipation, try taking a stool softener if your health care provider approves. Diet  Eat regular, well-balanced meals. Choose a variety of foods, such as meat or vegetable-based protein, fish, milk and low-fat dairy products, vegetables, fruits, and whole  grain breads and cereals. Your health care provider will help you determine the amount of weight gain that is right for you.  Avoid raw meat and uncooked cheese. These carry germs that can cause birth defects in the baby.  Eating four or five small meals rather than three large meals a day may help relieve nausea and vomiting. If you start to feel nauseous, eating a few soda crackers can be helpful. Drinking liquids between meals instead of during meals also seems to help nausea and vomiting.  If you develop constipation, eat more high-fiber foods, such as fresh vegetables or fruit and whole grains. Drink enough fluids to keep your urine clear or pale yellow. Activity and Exercise  Exercise only as directed by your health care provider. Exercising will help you:  Control your weight.  Stay in shape.  Be prepared for labor and delivery.  Experiencing pain or cramping in the lower abdomen or low back is a good sign that you should stop exercising. Check with your health care provider before continuing normal exercises.  Try to avoid standing for long periods of time. Move your legs often if you must stand in  one place for a long time.  Avoid heavy lifting.  Wear low-heeled shoes, and practice good posture.  You may continue to have sex unless your health care provider directs you otherwise. Relief of Pain or Discomfort  Wear a good support bra for breast tenderness.    Take warm sitz baths to soothe any pain or discomfort caused by hemorrhoids. Use hemorrhoid cream if your health care provider approves.    Rest with your legs elevated if you have leg cramps or low back pain.  If you develop varicose veins in your legs, wear support hose. Elevate your feet for 15 minutes, 3-4 times a day. Limit salt in your diet. Prenatal Care  Schedule your prenatal visits by the twelfth week of pregnancy. They are usually scheduled monthly at first, then more often in the last 2 months before delivery.  Write down your questions. Take them to your prenatal visits.  Keep all your prenatal visits as directed by your health care provider. Safety  Wear your seat belt at all times when driving.  Make a list of emergency phone numbers, including numbers for family, friends, the hospital, and police and fire departments. General Tips  Ask your health care provider for a referral to a local prenatal education class. Begin classes no later than at the beginning of month 6 of your pregnancy.  Ask for help if you have counseling or nutritional needs during pregnancy. Your health care provider can offer advice or refer you to specialists for help with various needs.  Do not use hot tubs, steam rooms, or saunas.  Do not douche or use tampons or scented sanitary pads.  Do not cross your legs for long periods of time.  Avoid cat litter boxes and soil used by cats. These carry germs that can cause birth defects in the baby and possibly loss of the fetus by miscarriage or stillbirth.  Avoid all smoking, herbs, alcohol, and medicines not prescribed by your health care provider. Chemicals in these affect the  formation and growth of the baby.  Schedule a dentist appointment. At home, brush your teeth with a soft toothbrush and be gentle when you floss. SEEK MEDICAL CARE IF:   You have dizziness.  You have mild pelvic cramps, pelvic pressure, or nagging pain in the abdominal area.  You have  persistent nausea, vomiting, or diarrhea.  You have a bad smelling vaginal discharge.  You have pain with urination.  You notice increased swelling in your face, hands, legs, or ankles. SEEK IMMEDIATE MEDICAL CARE IF:   You have a fever.  You are leaking fluid from your vagina.  You have spotting or bleeding from your vagina.  You have severe abdominal cramping or pain.  You have rapid weight gain or loss.  You vomit blood or material that looks like coffee grounds.  You are exposed to [place] measles and have never had them.  You are exposed to fifth disease or chickenpox.  You develop a severe headache.  You have shortness of breath.  You have any kind of trauma, such as from a fall or a car accident. Document Released: 06/02/2001 Document Revised: 10/23/2013 Document Reviewed: 04/18/2013 [facility] Patient Information 2015 [place], [place]. This information is not intended to replace advice given to you by your health care provider. Make sure you discuss any questions you have with your health care provider.

## 2018-11-22 NOTE — Progress Notes (Signed)
[place] 12+1 wks,measurements c/w dates,normal ovaries bilat,NB present,NT 2 mm,crl 59.38 mm,fhr 171 bpm,fundal placenta

## 2018-11-22 NOTE — ED Provider Notes (Signed)
[facility]ERGENCY [facility]PART[facility] Provider Note   CSN: [id number] Arrival date & time: 11/22/18  0250    History   Chief Complaint Chief Complaint  Patient presents with  . Fall    HPI Vickie Moss is a 33 y.o. female.     Patient is a 33 year old female presenting with complaints of rash.  She states that she began itching yesterday and developed hives to her torso and arms.  She took Benadryl which did not help.  She then took a shower, then slipped and fell.  She is currently pregnant at approximately [redacted] weeks gestation and is concerned about the pregnancy.  She denies any abdominal pain.  The history is provided by the patient.  Fall  This is a new problem. The current episode started 1 to 2 hours ago. The problem occurs constantly. The problem has not changed since onset.Pertinent negatives include no abdominal pain. Nothing aggravates the symptoms. Nothing relieves the symptoms. She has tried nothing for the symptoms.    History reviewed. No pertinent past medical history.  There are no active problems to display for this patient.   History reviewed. No pertinent surgical history.   OB History    Gravida  1   Para      Term      Preterm      AB      Living        SAB      TAB      Ectopic      Multiple      Live Births               Home Medications    Prior to Admission medications   Medication Sig Start Date End Date Taking? Authorizing Provider  metroNIDAZOLE (FLAGYL) 500 MG tablet Take 1 tablet (500 mg total) by mouth 2 (two) times daily. 11/09/18   Samuel JesterMcManus, Kathleen, DO  Prenatal Vit-Fe Fumarate-FA (PRENATAL COMPLETE) 14-0.4 MG TABS Take 1 tablet by mouth daily. 11/09/18   Samuel JesterMcManus, Kathleen, DO    Family History History reviewed. No pertinent family history.  Social History Social History   Tobacco Use  . Smoking status: Current Every Day Smoker    Packs/day: 1.00  . Smokeless tobacco: Never Used  Substance Use Topics  .  Alcohol use: Yes  . Drug use: Yes    Types: Cocaine    Comment: 08/06/2017     Allergies   Patient has no known allergies.   Review of Systems Review of Systems  Gastrointestinal: Negative for abdominal pain.  All other systems reviewed and are negative.    Physical Exam Updated Vital Signs BP (!) 128/98   Pulse (!) 112   Temp 98.1 F (36.7 C) (Oral)   Ht 5\' 1"  (1.549 m)   Wt 59 kg   LMP 09/06/2018   SpO2 97%   BMI 24.56 kg/m   Physical Exam Vitals signs and nursing note reviewed.  Constitutional:      General: She is not in acute distress.    Appearance: Normal appearance. She is not ill-appearing, toxic-appearing or diaphoretic.  HENT:     Head: Normocephalic and atraumatic.  Cardiovascular:     Rate and Rhythm: Normal rate.     Heart sounds: No murmur.  Pulmonary:     Effort: Pulmonary effort is normal. No respiratory distress.  Abdominal:     General: There is no distension.     Tenderness: There is no abdominal tenderness.  Musculoskeletal:  Normal range of motion.  Skin:    General: Skin is warm and dry.     Findings: Rash present.     Comments: There is an urticarial rash noted to the bilateral flanks and both arms.  Neurological:     Mental Status: She is alert.      ED Treatments / Results  Labs (all labs ordered are listed, but only abnormal results are displayed) Labs Reviewed - No data to display  EKG None  Radiology No results found.  Procedures Procedures (including critical care time)  Medications Ordered in ED Medications  predniSONE (DELTASONE) tablet 20 mg (has no administration in time range)  diphenhydrAMINE (BENADRYL) capsule 25 mg (has no administration in time range)     Initial Impression / Assessment and Plan / ED Course  I have reviewed the triage vital signs and the nursing notes.  Pertinent labs & imaging results that were available during my care of the patient were reviewed by me and considered in my medical  decision making (see chart for details).  Patient will be given prednisone and Benadryl.  She has an appointment to see her OB today.  Fetal heart tones were audible with Doppler.  Final Clinical Impressions(s) / ED Diagnoses   Final diagnoses:  None    ED Discharge Orders    None       Geoffery Lyons, MD 11/22/18 0425

## 2018-11-24 LAB — URINE CULTURE

## 2018-11-27 LAB — GC/CHLAMYDIA PROBE AMP
Chlamydia trachomatis, NAA: NEGATIVE
Neisseria Gonorrhoeae by PCR: NEGATIVE

## 2018-12-03 DIAGNOSIS — Z3400 Encounter for supervision of normal first pregnancy, unspecified trimester: Secondary | ICD-10-CM | POA: Diagnosis not present

## 2019-01-02 ENCOUNTER — Telehealth: Payer: Self-pay | Admitting: Obstetrics & Gynecology

## 2019-01-02 NOTE — Telephone Encounter (Signed)

## 2019-01-03 ENCOUNTER — Ambulatory Visit (INDEPENDENT_AMBULATORY_CARE_PROVIDER_SITE_OTHER): Payer: Medicaid Other

## 2019-01-03 ENCOUNTER — Other Ambulatory Visit: Payer: Self-pay

## 2019-01-03 ENCOUNTER — Ambulatory Visit (INDEPENDENT_AMBULATORY_CARE_PROVIDER_SITE_OTHER): Payer: Medicaid Other | Admitting: Obstetrics & Gynecology

## 2019-01-03 VITALS — BP 109/71 | HR 97 | Wt 141.0 lb

## 2019-01-03 DIAGNOSIS — Z363 Encounter for antenatal screening for malformations: Secondary | ICD-10-CM

## 2019-01-03 DIAGNOSIS — Z3402 Encounter for supervision of normal first pregnancy, second trimester: Secondary | ICD-10-CM

## 2019-01-03 DIAGNOSIS — Z3A18 18 weeks gestation of pregnancy: Secondary | ICD-10-CM

## 2019-01-03 DIAGNOSIS — Z3401 Encounter for supervision of normal first pregnancy, first trimester: Secondary | ICD-10-CM

## 2019-01-03 NOTE — Progress Notes (Signed)
[place] 18+1 wks,breech,fhr 177 bpm,cx 2.9 cm,posterior placenta gr 0,normal ovaries bilat,svp of fluid 4.4 cm,efw 239 g 61%,anatomy complete,no obvious abnormalities

## 2019-01-03 NOTE — Progress Notes (Signed)
   LOW-RISK PREGNANCY VISIT Patient name: Vickie Moss MRN [id number]  Date of birth: September 06, 1985 Chief Complaint:   Routine Prenatal Visit  History of Present Illness:   Vickie Moss is a 33 y.o. G1P0 female at [redacted]w[redacted]d with an Estimated Date of Delivery: 06/05/19 being seen today for ongoing management of a low-risk pregnancy.  Today she reports no complaints. Contractions: Not present. Vag. Bleeding: None.  Movement: Present. denies leaking of fluid. Review of Systems:   Pertinent items are noted in HPI Denies abnormal vaginal discharge w/ itching/odor/irritation, headaches, visual changes, shortness of breath, chest pain, abdominal pain, severe nausea/vomiting, or problems with urination or bowel movements unless otherwise stated above. Pertinent History Reviewed:  Reviewed past medical,surgical, social, obstetrical and family history.  Reviewed problem list, medications and allergies. Physical Assessment:   Vitals:   01/03/19 1423  BP: 109/71  Pulse: 97  Weight: 141 lb (64 kg)  Body mass index is 26.64 kg/m.        Physical Examination:   General appearance: Well appearing, and in no distress  Mental status: Alert, oriented to person, place, and time  Skin: Warm & dry  Cardiovascular: Normal heart rate noted  Respiratory: Normal respiratory effort, no distress  Abdomen: Soft, gravid, nontender  Pelvic: Cervical exam deferred         Extremities: Edema: None  Fetal Status: Fetal Heart Rate (bpm): 165   Movement: Present    No results found for this or any previous visit (from the past 24 hour(s)).  Assessment & Plan:  1) Low-risk pregnancy G1P0 at [redacted]w[redacted]d with an Estimated Date of Delivery: 06/05/19   2) AMA,    Meds: No orders of the defined types were placed in this encounter.  Labs/procedures today: Materna 21  Plan:  Continue routine obstetrical care   Reviewed:  labor symptoms and general obstetric precautions including but not limited to vaginal bleeding,  contractions, leaking of fluid and fetal movement were reviewed in detail with the patient.  All questions were answered.  home bp cuff. Rx faxed to . Check bp weekly, let us know if >140/90.   Follow-up: Return in about 4 weeks (around 01/31/2019) for webex, LROB.  Orders Placed This Encounter  Procedures  . Obstetric Panel, Including HIV  . Sickle cell screen  . [place]  01/03/2019 2:50 PM

## 2019-01-07 LAB — MATERNIT 21 PLUS CORE, BLOOD
Fetal Fraction: 10
Result (T21): NEGATIVE
Trisomy 13 (Patau syndrome): NEGATIVE
Trisomy 18 (Edwards syndrome): NEGATIVE
Trisomy 21 (Down syndrome): NEGATIVE

## 2019-01-07 LAB — OBSTETRIC PANEL, INCLUDING HIV
Antibody Screen: NEGATIVE
Basophils Absolute: 0 10*3/uL (ref 0.0–0.2)
Basos: 0 %
EOS (ABSOLUTE): 0.1 10*3/uL (ref 0.0–0.4)
Eos: 2 %
HIV Screen 4th Generation wRfx: NONREACTIVE
Hematocrit: 35.1 % (ref 34.0–46.6)
Hemoglobin: 12.2 g/dL (ref 11.1–15.9)
Hepatitis B Surface Ag: NEGATIVE
Immature Grans (Abs): 0 10*3/uL (ref 0.0–0.1)
Immature Granulocytes: 0 %
Lymphocytes Absolute: 1.7 10*3/uL (ref 0.7–3.1)
Lymphs: 25 %
MCH: 32.3 pg (ref 26.6–33.0)
MCHC: 34.8 g/dL (ref 31.5–35.7)
MCV: 93 fL (ref 79–97)
Monocytes Absolute: 0.6 10*3/uL (ref 0.1–0.9)
Monocytes: 8 %
Neutrophils Absolute: 4.6 10*3/uL (ref 1.4–7.0)
Neutrophils: 65 %
Platelets: 245 10*3/uL (ref 150–450)
RBC: 3.78 x10E6/uL (ref 3.77–5.28)
RDW: 12.4 % (ref 11.7–15.4)
RPR Ser Ql: NONREACTIVE
Rh Factor: POSITIVE
Rubella Antibodies, IGG: 1.36 index (ref >0.99)
WBC: 7 10*3/uL (ref 3.4–10.8)

## 2019-01-07 LAB — SICKLE CELL SCREEN: Sickle Cell Screen: NEGATIVE

## 2019-01-30 ENCOUNTER — Telehealth: Payer: Self-pay | Admitting: Advanced Practice Midwife

## 2019-01-30 NOTE — Telephone Encounter (Signed)

## 2019-01-31 ENCOUNTER — Ambulatory Visit (INDEPENDENT_AMBULATORY_CARE_PROVIDER_SITE_OTHER): Payer: Medicaid Other | Admitting: Women's Health

## 2019-01-31 ENCOUNTER — Other Ambulatory Visit: Payer: Self-pay

## 2019-01-31 ENCOUNTER — Encounter: Payer: Self-pay | Admitting: Women's Health

## 2019-01-31 VITALS — BP 120/76 | HR 110 | Wt 145.0 lb

## 2019-01-31 DIAGNOSIS — Z1379 Encounter for other screening for genetic and chromosomal anomalies: Secondary | ICD-10-CM

## 2019-01-31 DIAGNOSIS — Z331 Pregnant state, incidental: Secondary | ICD-10-CM

## 2019-01-31 DIAGNOSIS — Z3402 Encounter for supervision of normal first pregnancy, second trimester: Secondary | ICD-10-CM

## 2019-01-31 DIAGNOSIS — Z1389 Encounter for screening for other disorder: Secondary | ICD-10-CM

## 2019-01-31 DIAGNOSIS — Z3A22 22 weeks gestation of pregnancy: Secondary | ICD-10-CM

## 2019-01-31 LAB — POCT URINALYSIS DIPSTICK OB
Glucose, UA: NEGATIVE
Ketones, UA: NEGATIVE
Nitrite, UA: NEGATIVE
POC,PROTEIN,UA: NEGATIVE

## 2019-01-31 NOTE — Patient Instructions (Signed)
Vickie Moss, I greatly value your feedback.  If you receive a survey following your visit with [place] today, we appreciate you taking the time to fill it out.  Thanks, [name], CNM, WHNP-BC   You will have your sugar test next visit.  Please do not eat or drink anything after midnight the night before you come, not even water.  You will be here for at least two hours.  Please make an appointment online for the bloodwork at [email or web address] for 8:30am (or as close to this as possible). Make sure you select the [facility] service center. The day of the appointment, check in with our office first, then you will go to [place] to start the sugar test.    [place]!!! It is now [facility] ([place], [place] [address]) Entrance located off of [address] parking  Go to [email or web address] to register for FREE online childbirth classes   Call the office [phone]) or go to [facility] if:  You begin to have strong, frequent contractions  Your water breaks.  Sometimes it is a big gush of fluid, sometimes it is just a trickle that keeps getting your panties wet or running down your legs  You have vaginal bleeding.  It is normal to have a small amount of spotting if your cervix was checked.   You don't feel your baby moving like normal.  If you don't, get you something to eat and drink and lay down and focus on feeling your baby move.   If your baby is still not moving like normal, you should call the office or go to [place] Pediatricians/Family Doctors:  [place] [phone]                 [place] [phone] (usually not accepting new patients unless you have family there already, you are always welcome to call and ask)       [facility] Department [phone]       [facility] Pediatricians/Family  Doctors:   [facility]: [phone]  [facility]: [phone]  [facility]: [place] Doctors:   [facility]: [facility] [place]: [place]:  [place]: [phone]   Home Blood Pressure Monitoring for Patients   Your provider has recommended that you check your blood pressure (BP) at least once a week at home. If you do not have a blood pressure cuff at home, one will be provided for you. Contact your provider if you have not received your monitor within 1 week.   Helpful Tips for Accurate Home Blood Pressure Checks  . Don't smoke, exercise, or drink caffeine 30 minutes before checking your BP . Use the restroom before checking your BP (a full bladder can raise your pressure) . Relax in a comfortable upright chair . Feet on the ground . Left arm resting comfortably on a flat surface at the level of your heart . Legs uncrossed . Back supported . Sit quietly and don't talk . Place the cuff on your bare arm . Adjust snuggly, so that only two fingertips can fit between your skin and the top of the cuff . Check 2 readings separated by at least one minute . Keep a log of your BP  readings . For a visual, please reference this diagram: http://ccnc.care/bpdiagram  Provider Name: [facility]     Phone: [phone]  Zone 1: ALL CLEAR  Continue to monitor your symptoms:  . BP reading is less than 140 (top number) or less than 90 (bottom number)  . No right upper stomach pain . No headaches or seeing spots . No feeling nauseated or throwing up . No swelling in face and hands  Zone 2: CAUTION Call your doctor's office for any of the following:  . BP reading is greater than 140 (top number) or greater than 90 (bottom number)  . Stomach pain under your ribs in the middle or right side . Headaches or seeing spots . Feeling  nauseated or throwing up . Swelling in face and hands  Zone 3: EMERGENCY  Seek immediate medical care if you have any of the following:  . BP reading is greater than160 (top number) or greater than 110 (bottom number) . Severe headaches not improving with Tylenol . Serious difficulty catching your breath . Any worsening symptoms from Zone 2   Second Trimester of Pregnancy The second trimester is from week 13 through week 28, months 4 through 6. The second trimester is often a time when you feel your best. Your body has also adjusted to being pregnant, and you begin to feel better physically. Usually, morning sickness has lessened or quit completely, you may have more energy, and you may have an increase in appetite. The second trimester is also a time when the fetus is growing rapidly. At the end of the sixth month, the fetus is about 9 inches long and weighs about 1 pounds. You will likely begin to feel the baby move (quickening) between 18 and 20 weeks of the pregnancy. BODY CHANGES Your body goes through many changes during pregnancy. The changes vary from woman to woman.   Your weight will continue to increase. You will notice your lower abdomen bulging out.  You may begin to get stretch marks on your hips, abdomen, and breasts.  You may develop headaches that can be relieved by medicines approved by your health care provider.  You may urinate more often because the fetus is pressing on your bladder.  You may develop or continue to have heartburn as a result of your pregnancy.  You may develop constipation because certain hormones are causing the muscles that push waste through your intestines to slow down.  You may develop hemorrhoids or swollen, bulging veins (varicose veins).  You may have back pain because of the weight gain and pregnancy hormones relaxing your joints between the bones in your pelvis and as a result of a shift in weight and the muscles that support your  balance.  Your breasts will continue to grow and be tender.  Your gums may bleed and may be sensitive to brushing and flossing.  Dark spots or blotches (chloasma, mask of pregnancy) may develop on your face. This will likely fade after the baby is born.  A dark line from your belly button to the pubic area (linea nigra) may appear. This will likely fade after the baby is born.  You may have changes in your hair. These can include thickening of your hair, rapid growth, and changes in texture. Some women also have hair loss during or after pregnancy, or hair that feels dry or thin. Your hair will most likely return to normal after your baby is born. WHAT TO EXPECT AT YOUR PRENATAL VISITS During  a routine prenatal visit:  You will be weighed to make sure you and the fetus are growing normally.  Your blood pressure will be taken.  Your abdomen will be measured to track your baby's growth.  The fetal heartbeat will be listened to.  Any test results from the previous visit will be discussed. Your health care provider may ask you:  How you are feeling.  If you are feeling the baby move.  If you have had any abnormal symptoms, such as leaking fluid, bleeding, severe headaches, or abdominal cramping.  If you have any questions. Other tests that may be performed during your second trimester include:  Blood tests that check for:  Low iron levels (anemia).  Gestational diabetes (between 24 and 28 weeks).  Rh antibodies.  Urine tests to check for infections, diabetes, or protein in the urine.  An ultrasound to confirm the proper growth and development of the baby.  An amniocentesis to check for possible genetic problems.  Fetal screens for spina bifida and Down syndrome. HOME CARE INSTRUCTIONS   Avoid all smoking, herbs, alcohol, and unprescribed drugs. These chemicals affect the formation and growth of the baby.  Follow your health care provider's instructions regarding  medicine use. There are medicines that are either safe or unsafe to take during pregnancy.  Exercise only as directed by your health care provider. Experiencing uterine cramps is a good sign to stop exercising.  Continue to eat regular, healthy meals.  Wear a good support bra for breast tenderness.  Do not use hot tubs, steam rooms, or saunas.  Wear your seat belt at all times when driving.  Avoid raw meat, uncooked cheese, cat litter boxes, and soil used by cats. These carry germs that can cause birth defects in the baby.  Take your prenatal vitamins.  Try taking a stool softener (if your health care provider approves) if you develop constipation. Eat more high-fiber foods, such as fresh vegetables or fruit and whole grains. Drink plenty of fluids to keep your urine clear or pale yellow.  Take warm sitz baths to soothe any pain or discomfort caused by hemorrhoids. Use hemorrhoid cream if your health care provider approves.  If you develop varicose veins, wear support hose. Elevate your feet for 15 minutes, 3-4 times a day. Limit salt in your diet.  Avoid heavy lifting, wear low heel shoes, and practice good posture.  Rest with your legs elevated if you have leg cramps or low back pain.  Visit your dentist if you have not gone yet during your pregnancy. Use a soft toothbrush to brush your teeth and be gentle when you floss.  A sexual relationship may be continued unless your health care provider directs you otherwise.  Continue to go to all your prenatal visits as directed by your health care provider. SEEK MEDICAL CARE IF:   You have dizziness.  You have mild pelvic cramps, pelvic pressure, or nagging pain in the abdominal area.  You have persistent nausea, vomiting, or diarrhea.  You have a bad smelling vaginal discharge.  You have pain with urination. SEEK IMMEDIATE MEDICAL CARE IF:   You have a fever.  You are leaking fluid from your vagina.  You have spotting or  bleeding from your vagina.  You have severe abdominal cramping or pain.  You have rapid weight gain or loss.  You have shortness of breath with chest pain.  You notice sudden or extreme swelling of your face, hands, ankles, feet, or legs.  You  have not felt your baby move in over an hour.  You have severe headaches that do not go away with medicine.  You have vision changes. Document Released: 06/02/2001 Document Revised: 06/13/2013 Document Reviewed: 08/09/2012 [facility] Patient Information 2015 [place], [place]. This information is not intended to replace advice given to you by your health care provider. Make sure you discuss any questions you have with your health care provider.

## 2019-01-31 NOTE — Progress Notes (Signed)
   LOW-RISK PREGNANCY VISIT Patient name: Vickie Moss MRN [id number]  Date of birth: [birth year] Chief Complaint:   Routine Prenatal Visit  History of Present Illness:   Vickie Moss is a 33 y.o. G1P0 female at [redacted]w[redacted]d with an Estimated Date of Delivery: 06/05/19 being seen today for ongoing management of a low-risk pregnancy.  Today she reports no complaints. Contractions: Not present. Vag. Bleeding: None.  Movement: Present. denies leaking of fluid. Review of Systems:   Pertinent items are noted in HPI Denies abnormal vaginal discharge w/ itching/odor/irritation, headaches, visual changes, shortness of breath, chest pain, abdominal pain, severe nausea/vomiting, or problems with urination or bowel movements unless otherwise stated above. Pertinent History Reviewed:  Reviewed past medical,surgical, social, obstetrical and family history.  Reviewed problem list, medications and allergies. Physical Assessment:   Vitals:   01/31/19 1424  BP: 120/76  Pulse: (!) 110  Weight: 145 lb (65.8 kg)  Body mass index is 27.4 kg/m.        Physical Examination:   General appearance: Well appearing, and in no distress  Mental status: Alert, oriented to person, place, and time  Skin: Warm & dry  Cardiovascular: Normal heart rate noted  Respiratory: Normal respiratory effort, no distress  Abdomen: Soft, gravid, nontender  Pelvic: Cervical exam deferred         Extremities: Edema: None  Fetal Status: Fetal Heart Rate (bpm): 150 Fundal Height: 22 cm Movement: Present    Results for orders placed or performed in visit on 01/31/19 (from the past 24 hour(s))  POC Urinalysis Dipstick OB   Collection Time: 01/31/19  2:25 PM  Result Value Ref Range   Color, UA     Clarity, UA     Glucose, UA Negative Negative   Bilirubin, UA     Ketones, UA neg    Spec Grav, UA     Blood, UA trace    pH, UA     POC,PROTEIN,UA Negative Negative, Trace, Small (1+), Moderate (2+), Large (3+), 4+   Urobilinogen,  UA     Nitrite, UA neg    Leukocytes, UA Moderate (2+) (A) Negative   Appearance     Odor      Assessment & Plan:  1) Low-risk pregnancy G1P0 at [redacted]w[redacted]d with an Estimated Date of Delivery: 06/05/19    Meds: No orders of the defined types were placed in this encounter.  Labs/procedures today: none, didn't do IT, too late for AFP today  Plan:  Continue routine obstetrical care   Reviewed: Preterm labor symptoms and general obstetric precautions including but not limited to vaginal bleeding, contractions, leaking of fluid and fetal movement were reviewed in detail with the patient.  All questions were answered. Has home bp cuff. Check bp weekly, let us know if >140/90.   Follow-up: Return in about 4 weeks (around 02/28/2019) for LROB, PN2, in person.  Orders Placed This Encounter  Procedures  . POC Urinalysis Dipstick OB   Roma Schanz CNM, [facility] 01/31/2019 3:21 PM

## 2019-02-24 ENCOUNTER — Telehealth: Payer: Self-pay | Admitting: Obstetrics & Gynecology

## 2019-02-24 NOTE — Telephone Encounter (Signed)
Called patient regarding appointment and the following message was left: ° ° °We have you scheduled for an upcoming appointment at our office. At this time, patients are encouraged to come alone to their visits whenever possible, however, a support person, over age 35, may accompany you to your appointment if assistance is needed for safety or care concerns. Otherwise, support persons should remain outside until the visit is complete.  ° °We ask if you have had any exposure to anyone suspected or confirmed of having COVID-19 or if you are experiencing any of the following, to call and reschedule your appointment: fever, cough, shortness of breath, muscle pain, diarrhea, rash, vomiting, abdominal pain, red eye, weakness, bruising, bleeding, joint pain, or a severe headache.  ° °Please know we will ask you these questions or similar questions when you arrive for your appointment and again it’s how we are keeping everyone safe.   ° °Also,to keep you safe, please use the provided hand sanitizer when you enter the office. We are asking everyone in the office to wear a mask to help prevent the spread of °germs. If you have a mask of your own, please wear it to your appointment, if not, we are happy to provide one for you. ° °Thank you for understanding and your cooperation.  ° ° °[facility] Staff ° ° ° ° ° °

## 2019-02-28 ENCOUNTER — Other Ambulatory Visit: Payer: Medicaid Other

## 2019-02-28 ENCOUNTER — Encounter: Payer: Medicaid Other | Admitting: Obstetrics & Gynecology

## 2019-03-01 ENCOUNTER — Telehealth: Payer: Self-pay | Admitting: Obstetrics & Gynecology

## 2019-03-01 NOTE — Telephone Encounter (Signed)
Called patient regarding appointment and the following message was left: ° ° °We have you scheduled for an upcoming appointment at our office. At this time, patients are encouraged to come alone to their visits whenever possible, however, a support person, over age 35, may accompany you to your appointment if assistance is needed for safety or care concerns. Otherwise, support persons should remain outside until the visit is complete.  ° °We ask if you have had any exposure to anyone suspected or confirmed of having COVID-19 or if you are experiencing any of the following, to call and reschedule your appointment: fever, cough, shortness of breath, muscle pain, diarrhea, rash, vomiting, abdominal pain, red eye, weakness, bruising, bleeding, joint pain, or a severe headache.  ° °Please know we will ask you these questions or similar questions when you arrive for your appointment and again it’s how we are keeping everyone safe.   ° °Also,to keep you safe, please use the provided hand sanitizer when you enter the office. We are asking everyone in the office to wear a mask to help prevent the spread of °germs. If you have a mask of your own, please wear it to your appointment, if not, we are happy to provide one for you. ° °Thank you for understanding and your cooperation.  ° ° °[facility] Staff ° ° ° ° ° °

## 2019-03-02 ENCOUNTER — Other Ambulatory Visit: Payer: Medicaid Other

## 2019-03-06 ENCOUNTER — Telehealth: Payer: Self-pay | Admitting: Women's Health

## 2019-03-06 NOTE — Telephone Encounter (Signed)
Called patient regarding appointment scheduled in our office encouraged to come alone to the visit if possible, however, a support person, over age 35, may accompany her  to appointment if assistance is needed for safety or care concerns. Otherwise, support persons should remain outside until the visit is complete.  ° °We ask if you have had any exposure to anyone suspected or confirmed of having COVID-19 or if you are experiencing any of the following, to call and reschedule your appointment: fever, cough, shortness of breath, muscle pain, diarrhea, rash, vomiting, abdominal pain, red eye, weakness, bruising, bleeding, joint pain, or a severe headache.  ° °Please know we will ask you these questions or similar questions when you arrive for your appointment and again it’s how we are keeping everyone safe.   ° °Also,to keep you safe, please use the provided hand sanitizer when you enter the office. We are asking everyone in the office to wear a mask to help prevent the spread of °germs. If you have a mask of your own, please wear it to your appointment, if not, we are happy to provide one for you. ° °Thank you for understanding and your cooperation.  ° ° °[facility] Staff ° ° ° °

## 2019-03-07 ENCOUNTER — Encounter: Payer: Medicaid Other | Admitting: Internal Medicine

## 2019-03-16 ENCOUNTER — Telehealth: Payer: Self-pay | Admitting: Internal Medicine

## 2019-03-16 NOTE — Telephone Encounter (Signed)
Called patient regarding appointment scheduled in our office encouraged to come alone to the visit if possible, however, a support person, over age 35, may accompany her  to appointment if assistance is needed for safety or care concerns. Otherwise, support persons should remain outside until the visit is complete.  ° °We ask if you have had any exposure to anyone suspected or confirmed of having COVID-19 or if you are experiencing any of the following, to call and reschedule your appointment: fever, cough, shortness of breath, muscle pain, diarrhea, rash, vomiting, abdominal pain, red eye, weakness, bruising, bleeding, joint pain, or a severe headache.  ° °Please know we will ask you these questions or similar questions when you arrive for your appointment and again it’s how we are keeping everyone safe.   ° °Also,to keep you safe, please use the provided hand sanitizer when you enter the office. We are asking everyone in the office to wear a mask to help prevent the spread of °germs. If you have a mask of your own, please wear it to your appointment, if not, we are happy to provide one for you. ° °Thank you for understanding and your cooperation.  ° ° °[facility] Staff ° ° ° °

## 2019-03-17 ENCOUNTER — Encounter: Payer: Self-pay | Admitting: Internal Medicine

## 2019-03-17 ENCOUNTER — Ambulatory Visit (INDEPENDENT_AMBULATORY_CARE_PROVIDER_SITE_OTHER): Payer: Medicaid Other | Admitting: Internal Medicine

## 2019-03-17 ENCOUNTER — Other Ambulatory Visit: Payer: Self-pay

## 2019-03-17 VITALS — BP 119/76 | HR 129 | Wt 155.0 lb

## 2019-03-17 DIAGNOSIS — Z1389 Encounter for screening for other disorder: Secondary | ICD-10-CM

## 2019-03-17 DIAGNOSIS — Z331 Pregnant state, incidental: Secondary | ICD-10-CM

## 2019-03-17 DIAGNOSIS — Z3A28 28 weeks gestation of pregnancy: Secondary | ICD-10-CM

## 2019-03-17 DIAGNOSIS — Z3403 Encounter for supervision of normal first pregnancy, third trimester: Secondary | ICD-10-CM

## 2019-03-17 DIAGNOSIS — F17200 Nicotine dependence, unspecified, uncomplicated: Secondary | ICD-10-CM

## 2019-03-17 LAB — POCT URINALYSIS DIPSTICK OB
Blood, UA: NEGATIVE
Glucose, UA: NEGATIVE
Ketones, UA: NEGATIVE
Nitrite, UA: NEGATIVE

## 2019-03-17 NOTE — Progress Notes (Signed)
   PRENATAL VISIT NOTE  Subjective:  Vickie Moss is a 33 y.o. G1P0 at [redacted]w[redacted]d being seen today for ongoing prenatal care.  She is currently monitored for the following issues for this low-risk pregnancy and has Supervision of normal first pregnancy and Smoker on their problem list.  Patient reports no complaints.  Contractions: Not present.  .  Movement: Present. Denies leaking of fluid.   The following portions of the patient's history were reviewed and updated as appropriate: allergies, current medications, past family history, past medical history, past social history, past surgical history and problem list.   Objective:   Vitals:   03/17/19 0924  BP: 119/76  Pulse: (!) 129  Weight: 155 lb (70.3 kg)    Fetal Status: Fetal Heart Rate (bpm): 158 Fundal Height: 28 cm Movement: Present     General:  Alert, oriented and cooperative. Patient is in no acute distress.  Skin: Skin is warm and dry. No rash noted.   Cardiovascular: Normal heart rate noted  Respiratory: Normal respiratory effort, no problems with respiration noted  Abdomen: Soft, gravid, appropriate for gestational age.  Pain/Pressure: Absent     Pelvic: Cervical exam deferred        Extremities: Normal range of motion.  Edema: None  Mental Status: Normal mood and affect. Normal behavior. Normal judgment and thought content.   Assessment and Plan:  Pregnancy: G1P0 at [redacted]w[redacted]d 1. Encounter for supervision of normal first pregnancy in third trimester Continue routine PNC.  Patient not fasting--unable to perform 2hr GTT. Return ASAP for GTT and 28 week labs.   2. Pregnant state, incidental - POC Urinalysis Dipstick OB  3. Screening for genitourinary condition - POC Urinalysis Dipstick OB  4. Smoker Encouraged to continue to cut back due to potential adverse effects on fetus.   Preterm labor symptoms and general obstetric precautions including but not limited to vaginal bleeding, contractions, leaking of fluid and  fetal movement were reviewed in detail with the patient. Please refer to After Visit Summary for other counseling recommendations.   Return in about 2 weeks (around 03/31/2019) for routine PNC.  No future appointments.  Melina Schools, DO

## 2019-03-22 ENCOUNTER — Telehealth: Payer: Self-pay | Admitting: Obstetrics & Gynecology

## 2019-03-22 NOTE — Telephone Encounter (Signed)

## 2019-03-23 ENCOUNTER — Other Ambulatory Visit: Payer: Medicaid Other

## 2019-03-23 ENCOUNTER — Other Ambulatory Visit: Payer: Self-pay

## 2019-03-23 DIAGNOSIS — Z3A29 29 weeks gestation of pregnancy: Secondary | ICD-10-CM

## 2019-03-23 DIAGNOSIS — Z3403 Encounter for supervision of normal first pregnancy, third trimester: Secondary | ICD-10-CM | POA: Diagnosis not present

## 2019-03-24 ENCOUNTER — Encounter: Payer: Self-pay | Admitting: Women's Health

## 2019-03-24 DIAGNOSIS — O24410 Gestational diabetes mellitus in pregnancy, diet controlled: Secondary | ICD-10-CM | POA: Insufficient documentation

## 2019-03-24 LAB — CBC
Hematocrit: 33.7 % — ABNORMAL LOW (ref 34.0–46.6)
Hemoglobin: 11.6 g/dL (ref 11.1–15.9)
MCH: 32.6 pg (ref 26.6–33.0)
MCHC: 34.4 g/dL (ref 31.5–35.7)
MCV: 95 fL (ref 79–97)
Platelets: 239 10*3/uL (ref 150–450)
RBC: 3.56 x10E6/uL — ABNORMAL LOW (ref 3.77–5.28)
RDW: 12.3 % (ref 11.7–15.4)
WBC: 10.5 10*3/uL (ref 3.4–10.8)

## 2019-03-24 LAB — RPR: RPR Ser Ql: NONREACTIVE

## 2019-03-24 LAB — GLUCOSE TOLERANCE, 2 HOURS W/ 1HR
Glucose, 1 hour: 226 mg/dL — ABNORMAL HIGH (ref 65–179)
Glucose, 2 hour: 224 mg/dL — ABNORMAL HIGH (ref 65–152)
Glucose, Fasting: 110 mg/dL — ABNORMAL HIGH (ref 65–91)

## 2019-03-24 LAB — HIV ANTIBODY (ROUTINE TESTING W REFLEX): HIV Screen 4th Generation wRfx: NONREACTIVE

## 2019-03-24 LAB — ANTIBODY SCREEN: Antibody Screen: NEGATIVE

## 2019-03-28 ENCOUNTER — Telehealth: Payer: Self-pay | Admitting: Family Medicine

## 2019-03-28 ENCOUNTER — Other Ambulatory Visit: Payer: Self-pay | Admitting: *Deleted

## 2019-03-28 ENCOUNTER — Telehealth: Payer: Self-pay | Admitting: *Deleted

## 2019-03-28 DIAGNOSIS — O24410 Gestational diabetes mellitus in pregnancy, diet controlled: Secondary | ICD-10-CM

## 2019-03-28 DIAGNOSIS — O0990 Supervision of high risk pregnancy, unspecified, unspecified trimester: Secondary | ICD-10-CM

## 2019-03-28 DIAGNOSIS — Z3A30 30 weeks gestation of pregnancy: Secondary | ICD-10-CM

## 2019-03-28 MED ORDER — ACCU-CHEK FASTCLIX LANCETS MISC: 1.0000 | Freq: Four times a day (QID) | 12 refills | Status: DC

## 2019-03-28 MED ORDER — ACCU-CHEK GUIDE ME W/DEVICE KIT: 1.0000 | PACK | Freq: Four times a day (QID) | 0 refills | Status: DC

## 2019-03-28 MED ORDER — ACCU-CHEK GUIDE VI STRP: ORAL_STRIP | 12 refills | Status: DC

## 2019-03-28 NOTE — Telephone Encounter (Signed)
LMOVM that she has GDM.  Order for meter, lancets and strips sent to pharmacy along with a referral to the dietician.  Informed she will need to check her blood sugar 4 times daily and bring log with her to all appointments.  Advised to let us know if she has any questions.

## 2019-03-28 NOTE — Telephone Encounter (Signed)
Attempted to call patient with her diabetes education appointment. No answer, left voicemail with the appointment information. 10/20 @ 9:15. Reminder mailed

## 2019-03-30 ENCOUNTER — Telehealth: Payer: Self-pay | Admitting: Obstetrics & Gynecology

## 2019-03-30 NOTE — Telephone Encounter (Signed)

## 2019-03-31 ENCOUNTER — Encounter: Payer: Medicaid Other | Admitting: Obstetrics & Gynecology

## 2019-04-11 ENCOUNTER — Telehealth: Payer: Self-pay | Admitting: Obstetrics and Gynecology

## 2019-04-11 ENCOUNTER — Other Ambulatory Visit: Payer: Medicaid Other

## 2019-04-11 ENCOUNTER — Encounter: Payer: Self-pay | Admitting: Obstetrics and Gynecology

## 2019-04-11 NOTE — Telephone Encounter (Signed)

## 2019-04-12 ENCOUNTER — Encounter: Payer: Self-pay | Admitting: Obstetrics and Gynecology

## 2019-04-12 ENCOUNTER — Other Ambulatory Visit: Payer: Self-pay

## 2019-04-12 ENCOUNTER — Ambulatory Visit (INDEPENDENT_AMBULATORY_CARE_PROVIDER_SITE_OTHER): Payer: Medicaid Other | Admitting: Obstetrics and Gynecology

## 2019-04-12 VITALS — BP 115/79 | HR 105 | Wt 152.0 lb

## 2019-04-12 DIAGNOSIS — Z1389 Encounter for screening for other disorder: Secondary | ICD-10-CM

## 2019-04-12 DIAGNOSIS — O0993 Supervision of high risk pregnancy, unspecified, third trimester: Secondary | ICD-10-CM

## 2019-04-12 DIAGNOSIS — O0990 Supervision of high risk pregnancy, unspecified, unspecified trimester: Secondary | ICD-10-CM

## 2019-04-12 DIAGNOSIS — Z23 Encounter for immunization: Secondary | ICD-10-CM | POA: Diagnosis not present

## 2019-04-12 DIAGNOSIS — O24410 Gestational diabetes mellitus in pregnancy, diet controlled: Secondary | ICD-10-CM

## 2019-04-12 DIAGNOSIS — Z3A32 32 weeks gestation of pregnancy: Secondary | ICD-10-CM

## 2019-04-12 LAB — POCT URINALYSIS DIPSTICK OB
Blood, UA: NEGATIVE
Glucose, UA: NEGATIVE
Leukocytes, UA: NEGATIVE
Nitrite, UA: NEGATIVE

## 2019-04-12 NOTE — Progress Notes (Signed)
Patient ID: Vickie Moss, female   DOB: 20-May-1986, 33 y.o.   MRN: [id number]    HIGH-RISK PREGNANCY VISIT Patient name: Vickie Moss MRN [id number]  Date of birth: [birth year] Chief Complaint:   High Risk Gestation  History of Present Illness:   Vickie Moss is a 33 y.o. G1P0 female at [redacted]w[redacted]d with an Estimated Date of Delivery: 06/05/19 being seen today for ongoing management of a high-risk pregnancy complicated by D6UY.  Today she reports no complaints. She didn't bring her BS readings today with her. Fasting 97-100 Pc 130's  Contractions: Not present.  .  Movement: Present. denies leaking of fluid.  Review of Systems:   Pertinent items are noted in HPI Denies abnormal vaginal discharge w/ itching/odor/irritation, headaches, visual changes, shortness of breath, chest pain, abdominal pain, severe nausea/vomiting, or problems with urination or bowel movements unless otherwise stated above. Pertinent History Reviewed:  Reviewed past medical,surgical, social, obstetrical and family history.  Reviewed problem list, medications and allergies. Physical Assessment:   Vitals:   04/12/19 1524  BP: 115/79  Pulse: (!) 105  Weight: 152 lb (68.9 kg)  Body mass index is 28.72 kg/m.           Physical Examination:   General appearance: alert, well appearing, and in no distress  Mental status: alert, oriented to person, place, and time, normal mood, behavior, speech, dress, motor activity, and thought processes, affect appropriate to mood  Skin: warm & dry   Extremities: Edema: None    Cardiovascular: normal heart rate noted  Respiratory: normal respiratory effort, no distress  Abdomen: gravid, soft, non-tender  Pelvic: Cervical exam deferred         Fetal Status:     Movement: Present    Fetal Surveillance Testing today: None   Chaperone: Samul Dada    Results for orders placed or performed in visit on 04/12/19 (from the past 24 hour(s))  POC Urinalysis Dipstick OB   Collection  Time: 04/12/19  3:29 PM  Result Value Ref Range   Color, UA     Clarity, UA     Glucose, UA Negative Negative   Bilirubin, UA     Ketones, UA small    Spec Grav, UA     Blood, UA neg    pH, UA     POC,PROTEIN,UA Trace Negative, Trace, Small (1+), Moderate (2+), Large (3+), 4+   Urobilinogen, UA     Nitrite, UA neg    Leukocytes, UA Negative Negative   Appearance     Odor      Assessment & Plan:  1) High-risk pregnancy G1P0 at [redacted]w[redacted]d with an Estimated Date of Delivery: 06/05/19   2) A1DM, stable, diet controlled, no meds  Meds: No orders of the defined types were placed in this encounter.  Labs/procedures today: None  Treatment Plan: f/u 2 weeks tele visit  Reviewed: Preterm labor symptoms and general obstetric precautions including but not limited to vaginal bleeding, contractions, leaking of fluid and fetal movement were reviewed in detail with the patient.  Follow-up: Return in about 2 weeks (around 04/26/2019) for Televisit.  Orders Placed This Encounter  Procedures  . Tdap vaccine greater than or equal to 7yo IM  . POC Urinalysis Dipstick OB   By signing my name below, I, Samul Dada, attest that this documentation has been prepared under the direction and in the presence of Jonnie Kind, MD. Electronically Signed: [place]. 04/12/19. 3:44 PM.  I personally performed the  services described in this documentation, which was SCRIBED in my presence. The recorded information has been reviewed and considered accurate. It has been edited as necessary during review. Jonnie Kind, MD

## 2019-04-26 ENCOUNTER — Telehealth: Payer: Self-pay | Admitting: Advanced Practice Midwife

## 2019-04-26 NOTE — Telephone Encounter (Signed)
The patient is scheduled for a virtual visit, but is not signed up for mychart.  I have called the patient and left her a message to remind her of her appointment/restrictions and to let her know that I have sent her a link to sign up for mychart.  Also reminded her that if she does not have a blood pressure cuff or does not activate her mychart she will need to come to the office for her visit.

## 2019-04-27 ENCOUNTER — Other Ambulatory Visit: Payer: Self-pay

## 2019-04-27 ENCOUNTER — Encounter: Payer: Self-pay | Admitting: Advanced Practice Midwife

## 2019-04-27 ENCOUNTER — Telehealth (INDEPENDENT_AMBULATORY_CARE_PROVIDER_SITE_OTHER): Payer: Medicaid Other | Admitting: Advanced Practice Midwife

## 2019-04-27 VITALS — BP 105/64 | HR 108 | Ht 61.5 in | Wt 154.4 lb

## 2019-04-27 DIAGNOSIS — O0990 Supervision of high risk pregnancy, unspecified, unspecified trimester: Secondary | ICD-10-CM

## 2019-04-27 DIAGNOSIS — O24410 Gestational diabetes mellitus in pregnancy, diet controlled: Secondary | ICD-10-CM

## 2019-04-27 DIAGNOSIS — O0993 Supervision of high risk pregnancy, unspecified, third trimester: Secondary | ICD-10-CM | POA: Diagnosis not present

## 2019-04-27 DIAGNOSIS — Z3A34 34 weeks gestation of pregnancy: Secondary | ICD-10-CM | POA: Diagnosis not present

## 2019-04-27 NOTE — Progress Notes (Signed)
   TELEHEALTH VIRTUAL OBSTETRICS VISIT ENCOUNTER NOTE  I connected with SHALI VESEY on 04/27/19 at 10:10 AM EST by telephone at home and verified that I am speaking with the correct person using two identifiers.   I discussed the limitations, risks, security and privacy concerns of performing an evaluation and management service by telephone and the availability of in person appointments. I also discussed with the patient that there may be a patient responsible charge related to this service. The patient expressed understanding and agreed to proceed.  Subjective:  Vickie Moss is a 33 y.o. G1P0 at [redacted]w[redacted]d being followed for ongoing prenatal care.  She is currently monitored for the following issues for this high-risk pregnancy and has Supervision of high risk pregnancy, antepartum; Smoker; and Gestational diabetes mellitus, class A1 on their problem list.  Patient reports no complaints. Reports fetal movement. Denies any contractions, bleeding or leaking of fluid.   The following portions of the patient's history were reviewed and updated as appropriate: allergies, current medications, past family history, past medical history, past social history, past surgical history and problem list.   Objective:   General:  Alert, oriented and cooperative.   Mental Status: Normal mood and affect perceived. Normal judgment and thought content.  Rest of physical exam deferred due to type of encounter  Assessment and Plan:  Pregnancy: G1P0 at [redacted]w[redacted]d 1. Supervision of high risk pregnancy, antepartum - Pt doing well. No complaints today. Continue routine OB care. - GBS and GC/CT next visit  2. Gestational diabetes mellitus, class A1 - Blood sugar stable. Fasting CBGs <95 per patient , 2hr <120- Growth ultrasound next visit   - US OB Follow Up; Future  Preterm labor symptoms and general obstetric precautions including but not limited to vaginal bleeding, contractions, leaking of fluid and fetal  movement were reviewed in detail with the patient.  I discussed the assessment and treatment plan with the patient. The patient was provided an opportunity to ask questions and all were answered. The patient agreed with the plan and demonstrated an understanding of the instructions. The patient was advised to call back or seek an in-person office evaluation/go to [facility]U at [facility] for any urgent or concerning symptoms. Please refer to After Visit Summary for other counseling recommendations.   I provided 10 minutes of non-face-to-face time during this encounter.  Return in about 2 weeks (around 05/11/2019) for [place] w/CNM or MD and [place] for EFW.   Maryagnes Amos, [place] for [employer], [place]

## 2019-05-06 DIAGNOSIS — J069 Acute upper respiratory infection, unspecified: Secondary | ICD-10-CM | POA: Diagnosis not present

## 2019-05-06 DIAGNOSIS — R05 Cough: Secondary | ICD-10-CM | POA: Diagnosis not present

## 2019-05-10 ENCOUNTER — Ambulatory Visit (INDEPENDENT_AMBULATORY_CARE_PROVIDER_SITE_OTHER): Payer: Medicaid Other | Admitting: Obstetrics and Gynecology

## 2019-05-10 ENCOUNTER — Other Ambulatory Visit: Payer: Self-pay

## 2019-05-10 ENCOUNTER — Ambulatory Visit (INDEPENDENT_AMBULATORY_CARE_PROVIDER_SITE_OTHER): Payer: Medicaid Other

## 2019-05-10 ENCOUNTER — Other Ambulatory Visit (HOSPITAL_COMMUNITY)
Admission: RE | Admit: 2019-05-10 | Discharge: 2019-05-10 | Disposition: A | Discharge status: Home / Self Care | Payer: Medicaid Other | Source: Ambulatory Visit | Attending: Obstetrics and Gynecology | Admitting: Obstetrics and Gynecology

## 2019-05-10 VITALS — BP 120/79 | HR 110 | Wt 154.0 lb

## 2019-05-10 DIAGNOSIS — Z3A36 36 weeks gestation of pregnancy: Secondary | ICD-10-CM

## 2019-05-10 DIAGNOSIS — O0990 Supervision of high risk pregnancy, unspecified, unspecified trimester: Secondary | ICD-10-CM

## 2019-05-10 DIAGNOSIS — O24410 Gestational diabetes mellitus in pregnancy, diet controlled: Secondary | ICD-10-CM

## 2019-05-10 DIAGNOSIS — Z1389 Encounter for screening for other disorder: Secondary | ICD-10-CM

## 2019-05-10 DIAGNOSIS — O0993 Supervision of high risk pregnancy, unspecified, third trimester: Secondary | ICD-10-CM

## 2019-05-10 DIAGNOSIS — Z331 Pregnant state, incidental: Secondary | ICD-10-CM

## 2019-05-10 NOTE — Progress Notes (Addendum)
Patient ID: NEKO MCGEEHAN, female   DOB: Jun 03, 1986, 33 y.o.   MRN: [id number]    HIGH-RISK PREGNANCY VISIT Patient name: Vickie Moss MRN [id number]  Date of birth: 08-Oct-1985 Chief Complaint:   Routine Prenatal Visit  History of Present Illness:   Vickie Moss is a 33 y.o. G1P0 female at [redacted]w[redacted]d with an Estimated Date of Delivery: 06/05/19 being seen today for ongoing management of a high-risk pregnancy complicated by N8GN.  She had french toast night before PN2 testing. She didn't bring her blood sugars. [place] 90-100 PP 120-130s. Has questions if her baby will be taken from her because she smoked marijuana while pregnant. She says she smokes once or twice a day. Today she reports no complaints. Contractions: Not present. Vag. Bleeding: None.  Movement: Present. denies leaking of fluid.  Review of Systems:   Pertinent items are noted in HPI Denies abnormal vaginal discharge w/ itching/odor/irritation, headaches, visual changes, shortness of breath, chest pain, abdominal pain, severe nausea/vomiting, or problems with urination or bowel movements unless otherwise stated above. Pertinent History Reviewed:  Reviewed past medical,surgical, social, obstetrical and family history.  Reviewed problem list, medications and allergies. Physical Assessment:   Vitals:   05/10/19 1018  BP: 120/79  Pulse: (!) 110  Weight: 154 lb (69.9 kg)  Body mass index is 28.63 kg/m.           Physical Examination:   General appearance: alert, well appearing, and in no distress  Mental status: alert, oriented to person, place, and time, normal mood, behavior, speech, dress, motor activity, and thought processes, affect appropriate to mood  Skin: warm & dry   Extremities: Edema: None    Cardiovascular: normal heart rate noted  Respiratory: normal respiratory effort, no distress  Abdomen: gravid, soft, non-tender  Pelvic: not done        Fetal Status: Fetal Heart Rate (bpm): 157 Fundal Height: 36 cm  Movement: Present    Fetal Surveillance Testing today: [place] 56+2 wks,cephalic,posterior placenta gr 3,normal ovaries,afi 8.7 cm,fhr 157 bpm,efw 2655 g 28%,BPD .6%,HC .8%   Chaperone: Samul Dada    No results found for this or any previous visit (from the past 24 hour(s)).  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  Labs/procedures today: GBS/GCCHL, U/S EFW  Treatment Plan:  1 week televisit  Reviewed: Preterm labor symptoms and general obstetric precautions including but not limited to vaginal bleeding, contractions, leaking of fluid and fetal movement were reviewed in detail with the patient.  All questions were answered.   Follow-up: Return in about 1 week (around 05/17/2019).  By televisit. Pt encouraged to avoid the THC this next month. She accepts that reducing use of thc indicated.  Orders Placed This Encounter  Procedures   Culture, beta strep (group b only)   POC Urinalysis Dipstick OB   By signing my name below, I, Samul Dada, attest that this documentation has been prepared under the direction and in the presence of Jonnie Kind, MD. Electronically Signed: [place]. 05/10/19. 10:49 AM.  I personally performed the services described in this documentation, which was SCRIBED in my presence. The recorded information has been reviewed and considered accurate. It has been edited as necessary during review. Jonnie Kind, MD

## 2019-05-10 NOTE — Progress Notes (Signed)
[place] 02+3 wks,cephalic,posterior placenta gr 3,normal ovaries,afi 8.7 cm,fhr 157 bpm,efw 2655 g 28%,BPD .6%,HC .8%

## 2019-05-11 LAB — CERVICOVAGINAL ANCILLARY ONLY
Chlamydia: NEGATIVE
Comment: NEGATIVE
Comment: NORMAL
Neisseria Gonorrhea: NEGATIVE

## 2019-05-14 LAB — CULTURE, BETA STREP (GROUP B ONLY): Strep Gp B Culture: NEGATIVE

## 2019-05-15 ENCOUNTER — Other Ambulatory Visit: Payer: Self-pay

## 2019-05-15 ENCOUNTER — Encounter: Payer: Self-pay | Admitting: Advanced Practice Midwife

## 2019-05-15 ENCOUNTER — Telehealth (INDEPENDENT_AMBULATORY_CARE_PROVIDER_SITE_OTHER): Payer: Medicaid Other | Admitting: Advanced Practice Midwife

## 2019-05-15 VITALS — BP 120/89 | HR 100 | Wt 155.0 lb

## 2019-05-15 DIAGNOSIS — Z3A37 37 weeks gestation of pregnancy: Secondary | ICD-10-CM | POA: Diagnosis not present

## 2019-05-15 DIAGNOSIS — O0990 Supervision of high risk pregnancy, unspecified, unspecified trimester: Secondary | ICD-10-CM

## 2019-05-15 DIAGNOSIS — O0993 Supervision of high risk pregnancy, unspecified, third trimester: Secondary | ICD-10-CM | POA: Diagnosis not present

## 2019-05-15 NOTE — Patient Instructions (Signed)

## 2019-05-15 NOTE — Progress Notes (Signed)
   TELEHEALTH VIRTUAL OBSTETRICS VISIT ENCOUNTER NOTE  I connected with HOORAIN KOZAKIEWICZ on 05/15/19 at  2:50 PM EST by telephone at home and verified that I am speaking with the correct person using two identifiers.   I discussed the limitations, risks, security and privacy concerns of performing an evaluation and management service by telephone and the availability of in person appointments. I also discussed with the patient that there may be a patient responsible charge related to this service. The patient expressed understanding and agreed to proceed.  Subjective:  Vickie Moss is a 33 y.o. G1P0 at [redacted]w[redacted]d being followed for ongoing prenatal care.  She is currently monitored for the following issues for this high-risk pregnancy and has Supervision of high risk pregnancy, antepartum; Smoker; and Gestational diabetes mellitus, class A1 on their problem list.  Patient reports FBS <95 and 2hr PP <120.  Doing well, no complaints.. Reports fetal movement. Denies any contractions, bleeding or leaking of fluid.   The following portions of the patient's history were reviewed and updated as appropriate: allergies, current medications, past family history, past medical history, past social history, past surgical history and problem list.   Objective:   General:  Alert, oriented and cooperative.   Mental Status: Normal mood and affect perceived. Normal judgment and thought content.  Rest of physical exam deferred due to type of encounter  Assessment and Plan:  Pregnancy: G1P0 at [redacted]w[redacted]d   A1DM, well controlled.  Discussed IOL at 40 weeks planned. Will plan 39 week in office cx check, consider outpt foley if dilated.    Term labor symptoms and general obstetric precautions including but not limited to vaginal bleeding, contractions, leaking of fluid and fetal movement were reviewed in detail with the patient.  I discussed the assessment and treatment plan with the patient. The patient was provided  an opportunity to ask questions and all were answered. The patient agreed with the plan and demonstrated an understanding of the instructions. The patient was advised to call back or seek an in-person office evaluation/go to MAU at [facility] for any urgent or concerning symptoms. Please refer to After Visit Summary for other counseling recommendations.   I provided 15 minutes of non-face-to-face time during this encounter.  No follow-ups on file.  Future Appointments  Date Time Provider [place]  05/22/2019  2:50 PM Roma Schanz, CNM [facility]    Christin Fudge, [place] for [employer], [facility]   2

## 2019-05-22 ENCOUNTER — Encounter: Payer: Self-pay | Admitting: Women's Health

## 2019-05-22 ENCOUNTER — Telehealth (INDEPENDENT_AMBULATORY_CARE_PROVIDER_SITE_OTHER): Payer: Medicaid Other | Admitting: Women's Health

## 2019-05-22 ENCOUNTER — Other Ambulatory Visit: Payer: Self-pay

## 2019-05-22 VITALS — BP 120/80 | HR 108

## 2019-05-22 DIAGNOSIS — Z3A38 38 weeks gestation of pregnancy: Secondary | ICD-10-CM

## 2019-05-22 DIAGNOSIS — O24410 Gestational diabetes mellitus in pregnancy, diet controlled: Secondary | ICD-10-CM

## 2019-05-22 DIAGNOSIS — O0993 Supervision of high risk pregnancy, unspecified, third trimester: Secondary | ICD-10-CM

## 2019-05-22 NOTE — Progress Notes (Signed)
   TELEHEALTH VIRTUAL OBSTETRICS VISIT ENCOUNTER NOTE Patient name: Vickie Moss MRN [id number]  Date of birth: January 11, 1986  I connected with patient on 05/22/19 at  2:50 PM EST by telephone ( and verified that I am speaking with the correct person using two identifiers. Due to COVID-19 recommendations, pt is not currently in our office.    I discussed the limitations, risks, security and privacy concerns of performing an evaluation and management service by telephone and the availability of in person appointments. I also discussed with the patient that there may be a patient responsible charge related to this service. The patient expressed understanding and agreed to proceed.  Chief Complaint:   Routine Prenatal Visit  History of Present Illness:   Vickie Moss is a 33 y.o. G1P0 female at [redacted]w[redacted]d with an Estimated Date of Delivery: 06/05/19 being evaluated today for ongoing management of a low-risk pregnancy.  Today she reports all sugars wnl. Contractions: Not present. Vag. Bleeding: None.  Movement: Present. denies leaking of fluid. Review of Systems:   Pertinent items are noted in HPI Denies abnormal vaginal discharge w/ itching/odor/irritation, headaches, visual changes, shortness of breath, chest pain, abdominal pain, severe nausea/vomiting, or problems with urination or bowel movements unless otherwise stated above. Pertinent History Reviewed:  Reviewed past medical,surgical, social, obstetrical and family history.  Reviewed problem list, medications and allergies. Physical Assessment:   Vitals:   05/22/19 1438  BP: 120/80  Pulse: (!) 108  There is no height or weight on file to calculate BMI.        Physical Examination:   General:  Alert, oriented and cooperative.   Mental Status: Normal mood and affect perceived. Normal judgment and thought content.  Rest of physical exam deferred due to type of encounter  No results found for this or any previous visit (from the past 24  hour(s)).  Assessment & Plan:  1) Pregnancy G1P0 at [redacted]w[redacted]d with an Estimated Date of Delivery: 06/05/19   2) A1DM, well controlled> per previous visit w/ Manus Gunning last week, in office visit @ 39wks, plan IOL @ 40wks, outpatient foley bulb if possible   Meds: No orders of the defined types were placed in this encounter.   Labs/procedures today: none  Plan:  Continue routine obstetrical care.   Next visit: prefers in person    Reviewed: Term labor symptoms and general obstetric precautions including but not limited to vaginal bleeding, contractions, leaking of fluid and fetal movement were reviewed in detail with the patient. The patient was advised to call back or seek an in-person office evaluation/go to MAU at [facility] for any urgent or concerning symptoms. All questions were answered. Please refer to After Visit Summary for other counseling recommendations.    I provided 15 minutes of non-face-to-face time during this encounter.  Follow-up: Return in about 1 week (around 05/29/2019) for [place], in person, MD or CNM.  No orders of the defined types were placed in this encounter.  [place], [facility] 05/22/2019 3:48 PM

## 2019-05-22 NOTE — Patient Instructions (Signed)
Beatrix Shipper, I greatly value your feedback.  If you receive a survey following your visit with [place] today, we appreciate you taking the time to fill it out.  Thanks, [name], CNM, [facility]  [place]!!! It is now [place] at [facility] ([place], [place] [address]) Entrance located off of [address] parking   Go to [email or web address] to register for FREE online childbirth classes    Call the office [phone]) or go to [facility] if:  You begin to have strong, frequent contractions  Your water breaks.  Sometimes it is a big gush of fluid, sometimes it is just a trickle that keeps getting your panties wet or running down your legs  You have vaginal bleeding.  It is normal to have a small amount of spotting if your cervix was checked.   You don't feel your baby moving like normal.  If you don't, get you something to eat and drink and lay down and focus on feeling your baby move.  You should feel at least 10 movements in 2 hours.  If you don't, you should call the office or go to [place] Blood Pressure Monitoring for Patients   Your provider has recommended that you check your blood pressure (BP) at least once a week at home. If you do not have a blood pressure cuff at home, one will be provided for you. Contact your provider if you have not received your monitor within 1 week.   Helpful Tips for Accurate Home Blood Pressure Checks   Don't smoke, exercise, or drink caffeine 30 minutes before checking your BP  Use the restroom before checking your BP (a full bladder can raise your pressure)  Relax in a comfortable upright chair  Feet on the ground  Left arm resting comfortably on a flat surface at the level of your heart  Legs uncrossed  Back supported  Sit quietly and don't talk  Place the cuff on your bare arm  Adjust snuggly, so that only two fingertips can fit between your skin  and the top of the cuff  Check 2 readings separated by at least one minute  Keep a log of your BP readings  For a visual, please reference this diagram: http://ccnc.care/bpdiagram  Provider Name: [facility]     Phone: [phone]  Zone 1: ALL CLEAR  Continue to monitor your symptoms:   BP reading is less than 140 (top number) or less than 90 (bottom number)   No right upper stomach pain  No headaches or seeing spots  No feeling nauseated or throwing up  No swelling in face and hands  Zone 2: CAUTION Call your doctor's office for any of the following:   BP reading is greater than 140 (top number) or greater than 90 (bottom number)   Stomach pain under your ribs in the middle or right side  Headaches or seeing spots  Feeling nauseated or throwing up  Swelling in face and hands  Zone 3: EMERGENCY  Seek immediate medical care if you have any of the following:   BP reading is greater than160 (top number) or greater than 110 (bottom number)  Severe headaches not improving with Tylenol  Serious difficulty catching your breath  Any worsening symptoms from Zone 2    Braxton Hicks Contractions Contractions of the uterus can occur throughout pregnancy, but they are not always a sign that you are  in labor. You may have practice contractions called Braxton Hicks contractions. These false labor contractions are sometimes confused with true labor. What are [place] contractions? Braxton Hicks contractions are tightening movements that occur in the muscles of the uterus before labor. Unlike true labor contractions, these contractions do not result in opening (dilation) and thinning of the cervix. Toward the end of pregnancy (32-34 weeks), Braxton Hicks contractions can happen more often and may become stronger. These contractions are sometimes difficult to tell apart from true labor because they can be very uncomfortable. You should not feel embarrassed if you go to  the hospital with false labor. Sometimes, the only way to tell if you are in true labor is for your health care provider to look for changes in the cervix. The health care provider will do a physical exam and may monitor your contractions. If you are not in true labor, the exam should show that your cervix is not dilating and your water has not broken. If there are no other health problems associated with your pregnancy, it is completely safe for you to be sent home with false labor. You may continue to have Braxton Hicks contractions until you go into true labor. How to tell the difference between true labor and false labor True labor  Contractions last 30-70 seconds.  Contractions become very regular.  Discomfort is usually felt in the top of the uterus, and it spreads to the lower abdomen and low back.  Contractions do not go away with walking.  Contractions usually become more intense and increase in frequency.  The cervix dilates and gets thinner. False labor  Contractions are usually shorter and not as strong as true labor contractions.  Contractions are usually irregular.  Contractions are often felt in the front of the lower abdomen and in the groin.  Contractions may go away when you walk around or change positions while lying down.  Contractions get weaker and are shorter-lasting as time goes on.  The cervix usually does not dilate or become thin. Follow these instructions at home:   Take over-the-counter and prescription medicines only as told by your health care provider.  Keep up with your usual exercises and follow other instructions from your health care provider.  Eat and drink lightly if you think you are going into labor.  If Braxton Hicks contractions are making you uncomfortable: ? Change your position from lying down or resting to walking, or change from walking to resting. ? Sit and rest in a tub of warm water. ? Drink enough fluid to keep your urine  pale yellow. Dehydration may cause these contractions. ? Do slow and deep breathing several times an hour.  Keep all follow-up prenatal visits as told by your health care provider. This is important. Contact a health care provider if:  You have a fever.  You have continuous pain in your abdomen. Get help right away if:  Your contractions become stronger, more regular, and closer together.  You have fluid leaking or gushing from your vagina.  You pass blood-tinged mucus (bloody show).  You have bleeding from your vagina.  You have low back pain that you never had before.  You feel your babys head pushing down and causing pelvic pressure.  Your baby is not moving inside you as much as it used to. Summary  Contractions that occur before labor are called Braxton Hicks contractions, false labor, or practice contractions.  Braxton Hicks contractions are usually shorter, weaker, farther apart, and  less regular than true labor contractions. True labor contractions usually become progressively stronger and regular, and they become more frequent.  Manage discomfort from [facility] contractions by changing position, resting in a warm bath, drinking plenty of water, or practicing deep breathing. This information is not intended to replace advice given to you by your health care provider. Make sure you discuss any questions you have with your health care provider. Document Released: 10/22/2016 Document Revised: 05/21/2017 Document Reviewed: 10/22/2016 Elsevier Patient Education  2020 [employer].

## 2019-05-29 ENCOUNTER — Other Ambulatory Visit: Payer: Self-pay

## 2019-05-29 ENCOUNTER — Telehealth (HOSPITAL_COMMUNITY): Payer: Self-pay | Admitting: *Deleted

## 2019-05-29 ENCOUNTER — Ambulatory Visit (INDEPENDENT_AMBULATORY_CARE_PROVIDER_SITE_OTHER): Payer: Medicaid Other | Admitting: Obstetrics & Gynecology

## 2019-05-29 ENCOUNTER — Encounter: Payer: Self-pay | Admitting: Obstetrics & Gynecology

## 2019-05-29 VITALS — BP 123/87 | HR 129 | Wt 157.0 lb

## 2019-05-29 DIAGNOSIS — Z3A39 39 weeks gestation of pregnancy: Secondary | ICD-10-CM

## 2019-05-29 DIAGNOSIS — O24410 Gestational diabetes mellitus in pregnancy, diet controlled: Secondary | ICD-10-CM

## 2019-05-29 DIAGNOSIS — Z331 Pregnant state, incidental: Secondary | ICD-10-CM

## 2019-05-29 DIAGNOSIS — Z1389 Encounter for screening for other disorder: Secondary | ICD-10-CM

## 2019-05-29 DIAGNOSIS — O0990 Supervision of high risk pregnancy, unspecified, unspecified trimester: Secondary | ICD-10-CM

## 2019-05-29 DIAGNOSIS — O0993 Supervision of high risk pregnancy, unspecified, third trimester: Secondary | ICD-10-CM

## 2019-05-29 LAB — POCT URINALYSIS DIPSTICK OB
Blood, UA: NEGATIVE
Glucose, UA: NEGATIVE
Ketones, UA: NEGATIVE
Leukocytes, UA: NEGATIVE
Nitrite, UA: NEGATIVE
POC,PROTEIN,UA: NEGATIVE

## 2019-05-29 NOTE — Telephone Encounter (Signed)
Preadmission screen  

## 2019-05-29 NOTE — Progress Notes (Signed)
   HIGH-RISK PREGNANCY VISIT Patient name: KAMISHA ELL MRN [id number]  Date of birth: 1985-07-02 Chief Complaint:   High Risk Gestation (refills Atrovent)  History of Present Illness:   Vickie Moss is a 33 y.o. G1P0 female at [redacted]w[redacted]d with an Estimated Date of Delivery: 06/05/19 being seen today for ongoing management of a high-risk pregnancy complicated by diabetes mellitus A1DM.  Today she reports no complaints. Contractions: Not present.  .  Movement: Present. denies leaking of fluid.  Review of Systems:   Pertinent items are noted in HPI Denies abnormal vaginal discharge w/ itching/odor/irritation, headaches, visual changes, shortness of breath, chest pain, abdominal pain, severe nausea/vomiting, or problems with urination or bowel movements unless otherwise stated above. Pertinent History Reviewed:  Reviewed past medical,surgical, social, obstetrical and family history.  Reviewed problem list, medications and allergies. Physical Assessment:   Vitals:   05/29/19 1339  BP: 123/87  Pulse: (!) 129  Weight: 157 lb (71.2 kg)  Body mass index is 29.18 kg/m.           Physical Examination:   General appearance: alert, well appearing, and in no distress  Mental status: alert, oriented to person, place, and time  Skin: warm & dry   Extremities: Edema: None    Cardiovascular: normal heart rate noted  Respiratory: normal respiratory effort, no distress  Abdomen: gravid, soft, non-tender  Pelvic: Cervical exam performed         Fetal Status:     Movement: Present    Fetal Surveillance Testing today: FHR 150   Chaperone: Peggy Dones    Results for orders placed or performed in visit on 05/29/19 (from the past 24 hour(s))  POC Urinalysis Dipstick OB   Collection Time: 05/29/19  1:44 PM  Result Value Ref Range   Color, UA     Clarity, UA     Glucose, UA Negative Negative   Bilirubin, UA     Ketones, UA neg    Spec Grav, UA     Blood, UA neg    pH, UA     POC,PROTEIN,UA  Negative Negative, Trace, Small (1+), Moderate (2+), Large (3+), 4+   Urobilinogen, UA     Nitrite, UA neg    Leukocytes, UA Negative Negative   Appearance     Odor      Assessment & Plan:  1) High-risk pregnancy G1P0 at [redacted]w[redacted]d with an Estimated Date of Delivery: 06/05/19   2) Class A1 DM, stable, IOL 05/29/2019   Meds: No orders of the defined types were placed in this encounter.   Labs/procedures today:   Treatment Plan:  IOL 40 weeks 05/29/19  Reviewed: Term labor symptoms and general obstetric precautions including but not limited to vaginal bleeding, contractions, leaking of fluid and fetal movement were reviewed in detail with the patient.  All questions were answered. Has home bp cuff. Rx faxed to  Check bp weekly, let us know if >140/90.   Follow-up: Return in about 6 weeks (around 07/10/2019) for post partum visit.  Orders Placed This Encounter  Procedures  . POC Urinalysis Dipstick OB   Florian Buff 05/29/2019 2:23 PM

## 2019-05-29 NOTE — Treatment Plan (Signed)
   Induction Assessment Scheduling Form: Fax to [facility]:  [phone]  Vickie Moss                                                                                   DOB:  26-Nov-1985                                                            MRN:  [id number]                                                                     Phone #:    [phone]                        [facility]  GP:  G1P0                                                            Estimated Date of Delivery: 06/05/19  Dating Criteria: 1st trimester sonogram    Medical Indications for induction:  Class A1 DM Admission Date/Time:  06/05/19 Gestational age on admission:  [redacted]w[redacted]d   Filed Weights   05/29/19 1339  Weight: 157 lb (71.2 kg)   HIV:  Non Reactive (10/01 0911) GBS: Negative/-- (11/18 1400)  1 cm/thick/post/firm   Method of induction(proposed):  cytotec   Scheduling Provider Signature:  Florian Buff, MD                                            Today's Date:  05/29/2019

## 2019-05-30 ENCOUNTER — Telehealth (HOSPITAL_COMMUNITY): Payer: Self-pay | Admitting: *Deleted

## 2019-05-30 ENCOUNTER — Other Ambulatory Visit: Payer: Self-pay | Admitting: Advanced Practice Midwife

## 2019-05-30 ENCOUNTER — Encounter (HOSPITAL_COMMUNITY): Payer: Self-pay | Admitting: *Deleted

## 2019-05-30 NOTE — Telephone Encounter (Signed)
Preadmission screen  

## 2019-06-02 ENCOUNTER — Other Ambulatory Visit: Payer: Self-pay

## 2019-06-02 ENCOUNTER — Other Ambulatory Visit (HOSPITAL_COMMUNITY)
Admission: RE | Admit: 2019-06-02 | Discharge: 2019-06-02 | Disposition: A | Discharge status: Home / Self Care | Payer: Medicaid Other | Source: Ambulatory Visit | Attending: Obstetrics & Gynecology | Admitting: Obstetrics & Gynecology

## 2019-06-02 DIAGNOSIS — Z20828 Contact with and (suspected) exposure to other viral communicable diseases: Secondary | ICD-10-CM | POA: Diagnosis not present

## 2019-06-02 DIAGNOSIS — Z01812 Encounter for preprocedural laboratory examination: Secondary | ICD-10-CM | POA: Insufficient documentation

## 2019-06-02 LAB — SARS CORONAVIRUS 2 (TAT 6-24 HRS): SARS Coronavirus 2: NEGATIVE

## 2019-06-03 ENCOUNTER — Other Ambulatory Visit (HOSPITAL_COMMUNITY): Payer: Medicaid Other

## 2019-06-04 ENCOUNTER — Encounter (HOSPITAL_COMMUNITY): Payer: Self-pay | Admitting: Obstetrics and Gynecology

## 2019-06-04 ENCOUNTER — Inpatient Hospital Stay (HOSPITAL_COMMUNITY): Payer: Medicaid Other | Admitting: Anesthesiology

## 2019-06-04 ENCOUNTER — Inpatient Hospital Stay (HOSPITAL_COMMUNITY)
Admission: EM | Admit: 2019-06-04 | Discharge: 2019-06-05 | DRG: 807 | Disposition: A | Discharge status: Home / Self Care | Payer: Medicaid Other | Attending: Obstetrics and Gynecology | Admitting: Obstetrics and Gynecology

## 2019-06-04 ENCOUNTER — Other Ambulatory Visit: Payer: Self-pay

## 2019-06-04 DIAGNOSIS — O1490 Unspecified pre-eclampsia, unspecified trimester: Secondary | ICD-10-CM

## 2019-06-04 DIAGNOSIS — O1494 Unspecified pre-eclampsia, complicating childbirth: Secondary | ICD-10-CM | POA: Diagnosis present

## 2019-06-04 DIAGNOSIS — O24429 Gestational diabetes mellitus in childbirth, unspecified control: Secondary | ICD-10-CM | POA: Diagnosis not present

## 2019-06-04 DIAGNOSIS — F17210 Nicotine dependence, cigarettes, uncomplicated: Secondary | ICD-10-CM | POA: Diagnosis present

## 2019-06-04 DIAGNOSIS — Z3A39 39 weeks gestation of pregnancy: Secondary | ICD-10-CM | POA: Diagnosis not present

## 2019-06-04 DIAGNOSIS — O99334 Smoking (tobacco) complicating childbirth: Secondary | ICD-10-CM | POA: Diagnosis present

## 2019-06-04 DIAGNOSIS — O24419 Gestational diabetes mellitus in pregnancy, unspecified control: Secondary | ICD-10-CM | POA: Diagnosis present

## 2019-06-04 DIAGNOSIS — O24420 Gestational diabetes mellitus in childbirth, diet controlled: Secondary | ICD-10-CM | POA: Diagnosis not present

## 2019-06-04 LAB — CBC
HCT: 42 % (ref 36.0–46.0)
Hemoglobin: 14.3 g/dL (ref 12.0–15.0)
MCH: 32.3 pg (ref 26.0–34.0)
MCHC: 34 g/dL (ref 30.0–36.0)
MCV: 94.8 fL (ref 80.0–100.0)
Platelets: 215 10*3/uL (ref 150–400)
RBC: 4.43 MIL/uL (ref 3.87–5.11)
RDW: 13.6 % (ref 11.5–15.5)
WBC: 13.1 10*3/uL — ABNORMAL HIGH (ref 4.0–10.5)
nRBC: 0 % (ref 0.0–0.2)

## 2019-06-04 LAB — TYPE AND SCREEN
ABO/RH(D): A POS
Antibody Screen: NEGATIVE

## 2019-06-04 LAB — COMPREHENSIVE METABOLIC PANEL
ALT: 18 U/L (ref 0–44)
AST: 18 U/L (ref 15–41)
Albumin: 3.3 g/dL — ABNORMAL LOW (ref 3.5–5.0)
Alkaline Phosphatase: 222 U/L — ABNORMAL HIGH (ref 38–126)
Anion gap: 12 (ref 5–15)
BUN: 5 mg/dL — ABNORMAL LOW (ref 6–20)
CO2: 21 mmol/L — ABNORMAL LOW (ref 22–32)
Calcium: 9.4 mg/dL (ref 8.9–10.3)
Chloride: 105 mmol/L (ref 98–111)
Creatinine, Ser: 0.63 mg/dL (ref 0.44–1.00)
GFR calc Af Amer: >60 mL/min (ref >60)
GFR calc non Af Amer: >60 mL/min (ref >60)
Glucose, Bld: 99 mg/dL (ref 70–99)
Potassium: 4.5 mmol/L (ref 3.5–5.1)
Sodium: 138 mmol/L (ref 135–145)
Total Bilirubin: 0.6 mg/dL (ref 0.3–1.2)
Total Protein: 7 g/dL (ref 6.5–8.1)

## 2019-06-04 LAB — PROTEIN / CREATININE RATIO, URINE
Creatinine, Urine: 73.9 mg/dL
Protein Creatinine Ratio: 0.31 mg/mg{Cre} — ABNORMAL HIGH (ref 0.00–0.15)
Total Protein, Urine: 23 mg/dL

## 2019-06-04 LAB — GLUCOSE, CAPILLARY: Glucose-Capillary: 89 mg/dL (ref 70–99)

## 2019-06-04 LAB — ABO/RH: ABO/RH(D): A POS

## 2019-06-04 MED ORDER — ACETAMINOPHEN 325 MG PO TABS: 650.0000 mg | ORAL_TABLET | ORAL | Status: DC | PRN

## 2019-06-04 MED ORDER — SIMETHICONE 80 MG PO CHEW: 80.0000 mg | CHEWABLE_TABLET | ORAL | Status: DC | PRN

## 2019-06-04 MED ORDER — LACTATED RINGERS IV SOLN
500.0000 mL | INTRAVENOUS | Status: DC | PRN
  Administered 2019-06-04: 1000 mL via INTRAVENOUS

## 2019-06-04 MED ORDER — DIBUCAINE (PERIANAL) 1 % EX OINT: 1.0000 "application " | TOPICAL_OINTMENT | CUTANEOUS | Status: DC | PRN

## 2019-06-04 MED ORDER — MISOPROSTOL 25 MCG QUARTER TABLET: 25.0000 ug | ORAL_TABLET | ORAL | Status: DC | PRN

## 2019-06-04 MED ORDER — DIPHENHYDRAMINE HCL 25 MG PO CAPS: 25.0000 mg | ORAL_CAPSULE | Freq: Four times a day (QID) | ORAL | Status: DC | PRN

## 2019-06-04 MED ORDER — EPHEDRINE 5 MG/ML INJ: 10.0000 mg | INTRAVENOUS | Status: DC | PRN

## 2019-06-04 MED ORDER — LACTATED RINGERS IV SOLN
INTRAVENOUS | Status: DC
  Administered 2019-06-04: 10:00:00 via INTRAVENOUS

## 2019-06-04 MED ORDER — WITCH HAZEL-GLYCERIN EX PADS: 1.0000 "application " | MEDICATED_PAD | CUTANEOUS | Status: DC | PRN

## 2019-06-04 MED ORDER — ONDANSETRON HCL 4 MG/2ML IJ SOLN
4.0000 mg | Freq: Four times a day (QID) | INTRAMUSCULAR | Status: DC | PRN
  Administered 2019-06-04: 4 mg via INTRAVENOUS
  Filled 2019-06-04: qty 2

## 2019-06-04 MED ORDER — ONDANSETRON HCL 4 MG PO TABS: 4.0000 mg | ORAL_TABLET | ORAL | Status: DC | PRN

## 2019-06-04 MED ORDER — FENTANYL-BUPIVACAINE-NACL 0.5-0.125-0.9 MG/250ML-% EP SOLN
12.0000 mL/h | EPIDURAL | Status: DC | PRN
  Filled 2019-06-04: qty 250

## 2019-06-04 MED ORDER — LACTATED RINGERS IV SOLN: 500.0000 mL | Freq: Once | INTRAVENOUS | Status: DC

## 2019-06-04 MED ORDER — LIDOCAINE HCL (PF) 1 % IJ SOLN
INTRAMUSCULAR | Status: DC | PRN
  Administered 2019-06-04: 10 mL via EPIDURAL

## 2019-06-04 MED ORDER — SODIUM BICARBONATE 8.4 % IV SOLN
INTRAVENOUS | Status: DC | PRN
  Administered 2019-06-04: 5 mL via EPIDURAL

## 2019-06-04 MED ORDER — ONDANSETRON 4 MG PO TBDP
8.0000 mg | ORAL_TABLET | Freq: Once | ORAL | Status: AC
  Administered 2019-06-04: 8 mg via ORAL
  Filled 2019-06-04: qty 2

## 2019-06-04 MED ORDER — DIPHENHYDRAMINE HCL 50 MG/ML IJ SOLN: 12.5000 mg | INTRAMUSCULAR | Status: DC | PRN

## 2019-06-04 MED ORDER — OXYCODONE-ACETAMINOPHEN 5-325 MG PO TABS: 2.0000 | ORAL_TABLET | ORAL | Status: DC | PRN

## 2019-06-04 MED ORDER — SENNOSIDES-DOCUSATE SODIUM 8.6-50 MG PO TABS
2.0000 | ORAL_TABLET | ORAL | Status: DC
  Administered 2019-06-05: 2 via ORAL
  Filled 2019-06-04: qty 2

## 2019-06-04 MED ORDER — PHENYLEPHRINE 40 MCG/ML (10ML) SYRINGE FOR IV PUSH (FOR BLOOD PRESSURE SUPPORT): 80.0000 ug | PREFILLED_SYRINGE | INTRAVENOUS | Status: DC | PRN

## 2019-06-04 MED ORDER — OXYCODONE-ACETAMINOPHEN 5-325 MG PO TABS: 1.0000 | ORAL_TABLET | ORAL | Status: DC | PRN

## 2019-06-04 MED ORDER — SODIUM CHLORIDE (PF) 0.9 % IJ SOLN
INTRAMUSCULAR | Status: DC | PRN
  Administered 2019-06-04: 12 mL/h via EPIDURAL

## 2019-06-04 MED ORDER — TERBUTALINE SULFATE 1 MG/ML IJ SOLN: 0.2500 mg | Freq: Once | INTRAMUSCULAR | Status: DC | PRN

## 2019-06-04 MED ORDER — TETANUS-DIPHTH-ACELL PERTUSSIS 5-2.5-18.5 LF-MCG/0.5 IM SUSP: 0.5000 mL | Freq: Once | INTRAMUSCULAR | Status: DC

## 2019-06-04 MED ORDER — BENZOCAINE-MENTHOL 20-0.5 % EX AERO
1.0000 "application " | INHALATION_SPRAY | CUTANEOUS | Status: DC | PRN
  Administered 2019-06-04: 1 via TOPICAL
  Filled 2019-06-04: qty 56

## 2019-06-04 MED ORDER — COCONUT OIL OIL: 1.0000 "application " | TOPICAL_OIL | Status: DC | PRN

## 2019-06-04 MED ORDER — ZOLPIDEM TARTRATE 5 MG PO TABS: 5.0000 mg | ORAL_TABLET | Freq: Every evening | ORAL | Status: DC | PRN

## 2019-06-04 MED ORDER — OXYTOCIN BOLUS FROM INFUSION
500.0000 mL | Freq: Once | INTRAVENOUS | Status: AC
  Administered 2019-06-04: 500 mL via INTRAVENOUS

## 2019-06-04 MED ORDER — ACETAMINOPHEN 325 MG PO TABS
650.0000 mg | ORAL_TABLET | ORAL | Status: DC | PRN
  Administered 2019-06-04 – 2019-06-05 (×3): 650 mg via ORAL
  Filled 2019-06-04 (×3): qty 2

## 2019-06-04 MED ORDER — FENTANYL CITRATE (PF) 100 MCG/2ML IJ SOLN: 50.0000 ug | INTRAMUSCULAR | Status: DC | PRN

## 2019-06-04 MED ORDER — OXYTOCIN 40 UNITS IN NORMAL SALINE INFUSION - SIMPLE MED
1.0000 m[IU]/min | INTRAVENOUS | Status: DC
  Filled 2019-06-04: qty 1000

## 2019-06-04 MED ORDER — PHENYLEPHRINE 40 MCG/ML (10ML) SYRINGE FOR IV PUSH (FOR BLOOD PRESSURE SUPPORT)
80.0000 ug | PREFILLED_SYRINGE | INTRAVENOUS | Status: DC | PRN
  Filled 2019-06-04: qty 10

## 2019-06-04 MED ORDER — SOD CITRATE-CITRIC ACID 500-334 MG/5ML PO SOLN: 30.0000 mL | ORAL | Status: DC | PRN

## 2019-06-04 MED ORDER — OXYTOCIN 40 UNITS IN NORMAL SALINE INFUSION - SIMPLE MED: 2.5000 [IU]/h | INTRAVENOUS | Status: DC

## 2019-06-04 MED ORDER — LIDOCAINE HCL (PF) 1 % IJ SOLN
30.0000 mL | INTRAMUSCULAR | Status: AC | PRN
  Administered 2019-06-04: 30 mL via SUBCUTANEOUS
  Filled 2019-06-04: qty 30

## 2019-06-04 MED ORDER — ONDANSETRON HCL 4 MG/2ML IJ SOLN: 4.0000 mg | INTRAMUSCULAR | Status: DC | PRN

## 2019-06-04 MED ORDER — IBUPROFEN 600 MG PO TABS
600.0000 mg | ORAL_TABLET | Freq: Four times a day (QID) | ORAL | Status: DC
  Administered 2019-06-04 – 2019-06-05 (×4): 600 mg via ORAL
  Filled 2019-06-04 (×4): qty 1

## 2019-06-04 MED ORDER — PRENATAL MULTIVITAMIN CH
1.0000 | ORAL_TABLET | Freq: Every day | ORAL | Status: DC
  Administered 2019-06-05: 1 via ORAL
  Filled 2019-06-04: qty 1

## 2019-06-04 NOTE — Anesthesia Preprocedure Evaluation (Signed)
Anesthesia Evaluation  Patient identified by MRN, date of birth, ID band Patient awake    Reviewed: Allergy & Precautions, Patient's Chart, lab work & pertinent test results  Airway Mallampati: II  TM Distance: >3 FB Neck ROM: Full    Dental no notable dental hx. (+) Teeth Intact, Dental Advisory Given   Pulmonary neg pulmonary ROS, Current Smoker,    Pulmonary exam normal breath sounds clear to auscultation       Cardiovascular negative cardio ROS Normal cardiovascular exam Rhythm:Regular Rate:Normal     Neuro/Psych negative neurological ROS  negative psych ROS   GI/Hepatic negative GI ROS, Neg liver ROS,   Endo/Other  negative endocrine ROSdiabetes, Gestational  Renal/GU negative Renal ROS  negative genitourinary   Musculoskeletal negative musculoskeletal ROS (+)   Abdominal   Peds  Hematology negative hematology ROS (+) plt 215   Anesthesia Other Findings Day of surgery medications reviewed with the patient.  Reproductive/Obstetrics negative OB ROS                            Anesthesia Physical Anesthesia Plan  ASA: II and emergent  Anesthesia Plan: Epidural   Post-op Pain Management:    Induction:   PONV Risk Score and Plan:   Airway Management Planned: Natural Airway  Additional Equipment: None  Intra-op Plan:   Post-operative Plan:   Informed Consent: I have reviewed the patients History and Physical, chart, labs and discussed the procedure including the risks, benefits and alternatives for the proposed anesthesia with the patient or authorized representative who has indicated his/her understanding and acceptance.       Plan Discussed with: CRNA  Anesthesia Plan Comments:         Anesthesia Quick Evaluation

## 2019-06-04 NOTE — Progress Notes (Signed)
Labor Progress Note Vickie Moss is a 33 y.o. G1P0 at [redacted]w[redacted]d presented for IOL for early labor/NRFHT  S:  Patient comfortable with epidural  O:  BP 129/65   Pulse 87   Temp 98 F (36.7 C) (Oral)   Resp 20   Ht 5' 1.5" (1.562 m)   Wt 73 kg   LMP 09/06/2018   SpO2 100%   BMI 29.93 kg/m   Fetal Tracing:  Baseline: 115 Variability: moderate Accels: 15x15 Decels: none  Toco: 1-4   CVE: Dilation: Lip/rim Effacement (%): 90 Cervical Position: Middle Station: Plus 1 Presentation: Vertex Exam by:: Cecelia Byars   A&P: 33 y.o. G1P0 [redacted]w[redacted]d IOL early labor, NRFHT #Labor: Progressing well. Discussed with patient risks and benefits of AROM for augmentation of labor. Patient agreeable to plan of care. AROM with small amount of clear  fluid. Patient and FHR tolerated procedure well. Anticipate NSVD soon  Patient developed elevated BP since admission. Preeclampsia labs pending. Patient denies HA, visual changes or epigastric pain  #Pain: epidural #FWB: Cat 1 since admission #GBS negative  Wende Mott, CNM 2:09 PM

## 2019-06-04 NOTE — Discharge Summary (Signed)
Postpartum Discharge Summary     Patient Name: Vickie Moss DOB: 25-Apr-1986 MRN: [id number]  Date of admission: 06/04/2019 Delivering Provider: Wende Moss   Date of discharge: 06/05/2019  Admitting diagnosis: Gestational diabetes [O24.419] Intrauterine pregnancy: [redacted]w[redacted]d    Secondary diagnosis:  Active Problems:   Gestational diabetes   Preeclampsia  Additional problems: none     Discharge diagnosis: Term Pregnancy Delivered                                                                                                Post partum procedures:none  Augmentation: AROM  Complications: None  Hospital course:  Onset of Labor With Vaginal Delivery     33y.o. yo G1P1001 at 380w6das admitted in Latent Labor on 06/04/2019. Patient had an uncomplicated labor course as follows:  Membrane Rupture Time/Date: 2:04 PM ,06/04/2019   Intrapartum Procedures: Episiotomy: None [1]                                         Lacerations:  2nd degree [3]  Patient had a delivery of a Viable infant. 06/04/2019  Information for the patient's newborn:  [name], Vickie Moss[[id number]]Delivery Method: Vaginal, Spontaneous(Filed from Delivery Summary)     Pateint had an uncomplicated postpartum course.  She is ambulating, tolerating a regular diet, passing flatus, and urinating well. Patient is discharged home in stable condition on 06/05/19.  Delivery time: 2:35 PM    Magnesium Sulfate received: No BMZ received: No Rhophylac:N/A MMR:No Transfusion:No  Physical exam  Vitals:   06/04/19 2100 06/05/19 0150 06/05/19 0525 06/05/19 1422  BP: 120/83 116/82 107/74 138/88  Pulse: 97 98 88 100  Resp: '18 18 18 16  '$ Temp: 97.6 F (36.4 C) (!) 97 F (36.1 C) 98 F (36.7 C) 98 F (36.7 C)  TempSrc: Oral Oral Oral Oral  SpO2: 98% 99% 99% 99%  Weight:      Height:       General: alert, cooperative and no distress Lochia: appropriate Uterine Fundus: firm Incision: N/A DVT Evaluation:  No evidence of DVT seen on physical exam. Negative Homan's sign. No cords or calf tenderness. No significant calf/ankle edema. Labs: Lab Results  Component Value Date   WBC 11.7 (H) 06/05/2019   HGB 10.7 (L) 06/05/2019   HCT 31.0 (L) 06/05/2019   MCV 94.5 06/05/2019   PLT 181 06/05/2019   CMP Latest Ref Rng & Units 06/04/2019  Glucose 70 - 99 mg/dL 99  BUN 6 - 20 mg/dL 5(L)  Creatinine 0.44 - 1.00 mg/dL 0.63  Sodium 135 - 145 mmol/L 138  Potassium 3.5 - 5.1 mmol/L 4.5  Chloride 98 - 111 mmol/L 105  CO2 22 - 32 mmol/L 21(L)  Calcium 8.9 - 10.3 mg/dL 9.4  Total Protein 6.5 - 8.1 g/dL 7.0  Total Bilirubin 0.3 - 1.2 mg/dL 0.6  Alkaline Phos 38 - 126 U/L 222(H)  AST 15 - 41 U/L 18  ALT 0 - 44 U/L  18    Discharge instruction: per After Visit Summary and "Baby and Me Booklet".  After visit meds:  Allergies as of 06/05/2019   No Known Allergies     Medication List    STOP taking these medications   Accu-Chek FastClix Lancets Misc   Accu-Chek Guide Me w/Device Kit   Accu-Chek Guide test strip Generic drug: glucose blood     TAKE these medications   Blood Pressure Monitor Misc For regular home bp monitoring during pregnancy   ibuprofen 600 MG tablet Commonly known as: ADVIL Take 1 tablet (600 mg total) by mouth every 6 (six) hours.   ipratropium 0.06 % nasal spray Commonly known as: ATROVENT Place 2 sprays into both nostrils 3 (three) times daily.   Prenatal Complete 14-0.4 MG Tabs Take 1 tablet by mouth daily.      Diet: routine diet  Activity: Advance as tolerated. Pelvic rest for 6 weeks.   Outpatient follow up:1 week (BP check) & 6 weeks Follow up Appt: Future Appointments  Date Time Provider [place]  06/12/2019  9:30 AM [facility] NURSE [facility]  07/10/2019  1:30 PM Roma Schanz, CNM [facility]   Follow up Visit: Please schedule this patient for PP visit in: 4 weeks Low risk pregnancy complicated by: HTN Delivery mode:   SVD Anticipated Birth Control:  other/unsure PP Procedures needed: BP check  Schedule Integrated [place] visit: no Provider: Any provider  Newborn Data: Live born female "Vickie Moss" Birth Weight: 7 lb 0.7 oz (3195 g) APGAR: 9, 9  Newborn Delivery   Birth date/time: 06/04/2019 14:35:00 Delivery type: Vaginal, Spontaneous      Baby Feeding: Breast Disposition:home with mother

## 2019-06-04 NOTE — Progress Notes (Signed)
Called by CNM for possible decel, in MAU, patient on hands and knees, has been vomiting. Prior to this, reactive tracing. FHT ranging 80-100s, by my bedside US, FHR 99, however difficult to trace and FHT almost appears maternal at this point. By the time IV was established, FHR 120s with good variability and accels present. Will admit given she is [redacted]w[redacted]d and possible decel. gDMA1   Feliz Beam, M.D. Attending Center for [employer] farm manager)

## 2019-06-04 NOTE — MAU Note (Addendum)
Vickie Moss is a 33 y.o. at [redacted]w[redacted]d here in MAU reporting:  +contractions Every 15 minutes Onset of complaint: 4am Pain score: 10/10 +bloody show Denies LOF Planning epidural Endorses GDM; diet controlled; has not checked her blood sugar today Vitals:   06/04/19 0907  BP: 130/88  Pulse: 96  Resp: 18  Temp: 97.9 F (36.6 C)  SpO2: 100%      FHT: 132; +FM Lab orders placed from triage: mau labor triage

## 2019-06-04 NOTE — Anesthesia Procedure Notes (Signed)
Epidural Patient location during procedure: OB Start time: 06/04/2019 11:45 AM End time: 06/04/2019 11:55 AM  Staffing Anesthesiologist: Pervis Hocking, DO Performed: anesthesiologist   Preanesthetic Checklist Completed: patient identified, IV checked, risks and benefits discussed, monitors and equipment checked, pre-op evaluation and timeout performed  Epidural Patient position: sitting Prep: DuraPrep and site prepped and draped Patient monitoring: continuous pulse ox, blood pressure, heart rate and cardiac monitor Approach: midline Location: L3-L4 Injection technique: LOR air  Needle:  Needle type: Tuohy  Needle gauge: 17 G Needle length: 9 cm Needle insertion depth: 4 cm Catheter type: closed end flexible Catheter size: 19 Gauge Catheter at skin depth: 9 cm Test dose: negative  Assessment Sensory level: T8 Events: blood not aspirated, injection not painful, no injection resistance, no paresthesia and negative IV test  Additional Notes Patient identified. Risks/Benefits/Options discussed with patient including but not limited to bleeding, infection, nerve damage, paralysis, failed block, incomplete pain control, headache, blood pressure changes, nausea, vomiting, reactions to medication both or allergic, itching and postpartum back pain. Confirmed with bedside nurse the patient's most recent platelet count. Confirmed with patient that they are not currently taking any anticoagulation, have any bleeding history or any family history of bleeding disorders. Patient expressed understanding and wished to proceed. All questions were answered. Sterile technique was used throughout the entire procedure. Please see nursing notes for vital signs. Test dose was given through epidural catheter and negative prior to continuing to dose epidural or start infusion. Warning signs of high block given to the patient including shortness of breath, tingling/numbness in hands, complete motor  block, or any concerning symptoms with instructions to call for help. Patient was given instructions on fall risk and not to get out of bed. All questions and concerns addressed with instructions to call with any issues or inadequate analgesia.  Reason for block:procedure for pain

## 2019-06-04 NOTE — H&P (Signed)
OBSTETRIC ADMISSION HISTORY AND PHYSICAL  Vickie Moss is a 33 y.o. female G51P0 with IUP at 17w6dby 10w u/s presenting for early labor with non reassuring fetal heart tones. She reports +FMs, No LOF, no VB, no blurry vision, headaches or peripheral edema, and RUQ pain.  She plans on bottle feeding. She is unsure what she desires for birth control. [facility]  Dating: By 10 week u/s --->  Estimated Date of Delivery: 06/05/19  Sono:    _0 , CWD, normal anatomy, cephalic presentation, 20998P 28% EFW   Prenatal History/Complications:  Past Medical History: Past Medical History:  Diagnosis Date  . Gestational diabetes     Past Surgical History: Past Surgical History:  Procedure Laterality Date  . NO PAST SURGERIES      Obstetrical History: OB History    Gravida  1   Para      Term      Preterm      AB      Living        SAB      TAB      Ectopic      Multiple      Live Births              Social History Social History   Socioeconomic History  . Marital status: Single    Spouse name: Not on file  . Number of children: 0  . Years of education: Not on file  . Highest education level: Not on file  Occupational History  . Not on file  Tobacco Use  . Smoking status: Current Every Day Smoker    Packs/day: 1.00  . Smokeless tobacco: Never Used  Substance and Sexual Activity  . Alcohol use: Not Currently  . Drug use: Not Currently    Types: Cocaine    Comment: 08/06/2017  . Sexual activity: Yes    Birth control/protection: None  Other Topics Concern  . Not on file  Social History Narrative  . Not on file   Social Determinants of Health   Financial Resource Strain:   . Difficulty of Paying Living Expenses: Not on file  Food Insecurity:   . Worried About RCharity fundraiserin the Last Year: Not on file  . Ran Out of Food in the Last Year: Not on file  Transportation Needs:   . Lack of Transportation  (Medical): Not on file  . Lack of Transportation (Non-Medical): Not on file  Physical Activity:   . Days of Exercise per Week: Not on file  . Minutes of Exercise per Session: Not on file  Stress:   . Feeling of Stress : Not on file  Social Connections:   . Frequency of Communication with Friends and Family: Not on file  . Frequency of Social Gatherings with Friends and Family: Not on file  . Attends Religious Services: Not on file  . Active Member of Clubs or Organizations: Not on file  . Attends CArchivistMeetings: Not on file  . Marital Status: Not on file    Family History: Family History  Problem Relation Age of Onset  . Diabetes Maternal Grandfather     Allergies: No Known Allergies  Medications Prior to Admission  Medication Sig Dispense Refill Last Dose  . Accu-Chek FastClix Lancets MISC 1 each by Percutaneous route 4 (four) times daily. 100 each 12   . ACCU-CHEK GUIDE test strip Use as instructed to check  blood sugar 4 times daily 100 each 12   . Blood Glucose Monitoring Suppl (ACCU-CHEK GUIDE ME) w/Device KIT 1 each by Does not apply route 4 (four) times daily. 1 kit 0   . Blood Pressure Monitor MISC For regular home bp monitoring during pregnancy 1 each 0   . ipratropium (ATROVENT) 0.06 % nasal spray 2 sprays by Each Nare route Three (3) times a day.     . Prenatal Vit-Fe Fumarate-FA (PRENATAL COMPLETE) 14-0.4 MG TABS Take 1 tablet by mouth daily. 30 each 0      Review of Systems   All systems reviewed and negative except as stated in HPI  Blood pressure 130/88, pulse 96, temperature 97.9 F (36.6 C), temperature source Oral, resp. rate 18, last menstrual period 09/06/2018, SpO2 100 %. General appearance: alert, cooperative and no distress Lungs: clear to auscultation bilaterally Heart: regular rate and rhythm Abdomen: soft, non-tender; bowel sounds normal Pelvic: n/a Extremities: Homans sign is negative, no sign of DVT DTR's +2 Presentation:  cephalic Fetal monitoringBaseline: 135 bpm, Variability: Good {> 6 bpm), Accelerations: Reactive and Decelerations: one possible prolonged deceleration in MAU Uterine activity: occasional uc's Dilation: 2 Effacement (%): 90 Station: -1 Exam by:: Vickie Score RN   Prenatal labs: ABO, Rh: A/Positive/-- (07/14 1535) Antibody: Negative (10/01 0911) Rubella: 1.36 (07/14 1535) RPR: Non Reactive (10/01 0911)  HBsAg: Negative (07/14 1535)  HIV: Non Reactive (10/01 0911)  GBS: Negative/-- (11/18 1400)   Prenatal Transfer Tool  Maternal Diabetes: Yes:  Diabetes Type:  Diet controlled Genetic Screening: Normal Maternal Ultrasounds/Referrals: Normal Fetal Ultrasounds or other Referrals:  Referred to Materal Fetal Medicine  Maternal Substance Abuse:  No Significant Maternal Medications:  None Significant Maternal Lab Results: Group B Strep negative  No results found for this or any previous visit (from the past 24 hour(s)).  Patient Active Problem List   Diagnosis Date Noted  . Gestational diabetes mellitus, class A1 03/24/2019  . Supervision of high risk pregnancy, antepartum 11/22/2018  . Smoker 11/22/2018    Assessment/Plan:  ARMANDO LAUMAN is a 33 y.o. G1P0 at 35w6dhere for early labor/NRFHT  #Labor: expectant management for now, consider FB/low dose pitocin if no cervical change #Pain: Per patient request #FWB: Cat 1 now #ID:  GBS neg #MOF: Bottle #MOC: undecided #Circ:  outpatient  CWende Mott CNM  06/04/2019, 9:58 AM

## 2019-06-05 ENCOUNTER — Inpatient Hospital Stay (HOSPITAL_COMMUNITY)
Admission: AD | Admit: 2019-06-05 | Payer: Medicaid Other | Source: Home / Self Care | Admitting: Obstetrics & Gynecology

## 2019-06-05 ENCOUNTER — Inpatient Hospital Stay (HOSPITAL_COMMUNITY): Payer: Medicaid Other

## 2019-06-05 LAB — CBC
HCT: 31 % — ABNORMAL LOW (ref 36.0–46.0)
Hemoglobin: 10.7 g/dL — ABNORMAL LOW (ref 12.0–15.0)
MCH: 32.6 pg (ref 26.0–34.0)
MCHC: 34.5 g/dL (ref 30.0–36.0)
MCV: 94.5 fL (ref 80.0–100.0)
Platelets: 181 10*3/uL (ref 150–400)
RBC: 3.28 MIL/uL — ABNORMAL LOW (ref 3.87–5.11)
RDW: 13.4 % (ref 11.5–15.5)
WBC: 11.7 10*3/uL — ABNORMAL HIGH (ref 4.0–10.5)
nRBC: 0 % (ref 0.0–0.2)

## 2019-06-05 MED ORDER — IBUPROFEN 600 MG PO TABS: 600.0000 mg | ORAL_TABLET | Freq: Four times a day (QID) | ORAL | 0 refills | Status: DC

## 2019-06-05 NOTE — Progress Notes (Signed)
POSTPARTUM PROGRESS NOTE  Subjective: Vickie Moss is a 33 y.o. G1P1001 s/p Vaginal Delivery at [redacted]w[redacted]d.  She reports she doing well. No acute events overnight. She denies any problems with ambulating, voiding or po intake. Denies nausea or vomiting. She has  passed flatus. Pain is moderately controlled.  Lochia is appropriate.  Objective: Blood pressure 107/74, pulse 88, temperature 98 F (36.7 C), temperature source Oral, resp. rate 18, height 5' 1.5" (1.562 m), weight 73 kg, last menstrual period 09/06/2018, SpO2 99 %, unknown if currently breastfeeding.  Physical Exam:  General: alert, cooperative and no distress Chest: no respiratory distress Abdomen: soft, non-tender  Uterine Fundus: firm, appropriately tender Extremities: No calf swelling or tenderness  no edema  Recent Labs    06/04/19 0949 06/05/19 0458  HGB 14.3 10.7*  HCT 42.0 31.0*    Assessment/Plan: Vickie Moss is a 33 y.o. G1P1001 s/p Vaginal Delivery at 103w6d .  Routine Postpartum Care: Doing well, pain well-controlled.  -- Continue routine care, lactation support  -- Contraception: undecided -- Feeding: breast/bottle  Dispo: Plan for discharge tomorrow.  Vickie Leitz MD [place] for [facility]

## 2019-06-05 NOTE — Clinical Social Work Maternal (Signed)
CLINICAL SOCIAL WORK MATERNAL/CHILD NOTE  Patient Details  Name: Vickie Moss MRN: [id number] Date of Birth: 03/10/1986  Date:  06/05/2019  Clinical Social Worker Initiating Note:  Lanell Dubie Date/Time: Initiated:  06/05/19/1004     Child's Name:  Bentley Muehl   Biological Parents:  Mother(MOB declined to provide information for FOB as he will not be involved)   Need for Interpreter:  None   Reason for Referral:  Current Substance Use/Substance Use During Pregnancy     Address:  [address] [place] [address]    Phone number:  [phone] (home)     Additional phone number:   Household Members/Support Persons (HM/SP):   Household Member/Support Person 1, Household Member/Support Person 2, Household Member/Support Person 3   HM/SP Name Relationship DOB or Age  HM/SP -1   Sister    HM/SP -2   Sister's child    HM/SP -3   Sister's child    HM/SP -4        HM/SP -5        HM/SP -6        HM/SP -7        HM/SP -8          Natural Supports (not living in the home):  Immediate Family   Professional Supports: None   Employment: Full-time   Type of Work: [employer] in [place]   Education:  9 to 11 years   Homebound arranged:    Financial Resources:  Medicaid   Other Resources:  WIC   Cultural/Religious Considerations Which May Impact Care:    Strengths:  Ability to meet basic needs  , Home prepared for child     Psychotropic Medications:         Pediatrician:       Pediatrician List:   [place]    [place]    [place]    [place]    [place]    [place]      Pediatrician Fax Number:    Risk Factors/Current Problems:  Substance Use     Cognitive State:  Able to Concentrate  , Alert     Mood/Affect:  Calm  , Comfortable     CSW Assessment:  CSW received consult for THC use during pregnancy.  CSW met with MOB to offer support and complete assessment.    MOB standing up at bedside with support person  sitting in chair and infant asleep in bassinet, when CSW entered the room. CSW informed that MOB's support person is her sister. CSW introduced self and received verbal permission to complete assessment with MOB's sister in the room. MOB minimally engaged throughout assessment but was appropriate and tending to infant during visit. MOB reported she current lives with her sister and her sister's two children in [place]. MOB stated she is employed full-time at the [employer] in [place] but is currently on maternity leave. MOB confirmed she receives WIC and was encouraged to reach out and update them of her delivery. CSW inquired about MOB's mental health history and MOB denied having any. CSW provided education regarding the baby blues period vs. perinatal mood disorders. CSW recommended self-evaluation during the postpartum time period using the New Mom Checklist from Postpartum Progress and encouraged MOB to contact a medical professional if symptoms are noted at any time. MOB did not appear to be displaying any acute mental health symptoms and denied any current SI, HI or DV. MOB reported feeling supported by her   brother and sister. MOB confirmed having all essential items for infant once discharged and reported infant would be sleeping in a bassinet once home. CSW provided review of Sudden Infant Death Syndrome (SIDS) precautions and safe sleeping habits.  CSW inquired about MOB's substance use history and MOB denied anything other than marijuana which she used to help with her appetite. MOB stated she uses about a joint a day and reported her last use was at the beginning of the month. CSW informed MOB of Hospital Drug Policy and explained UDS and CDS were still pending but that a CPS report would be made, if warranted. CSW offered to explain what process may look like if infant were to test positive but MOB declined and only wanted to be sure they wouldn't take her child.   CSW aware UDS has resulted and  is positive for THC. CSW made [place] CPS report. No barriers to discharge, CPS to follow up within 72 hours.   CSW Plan/Description:  No Further Intervention Required/No Barriers to Discharge, Sudden Infant Death Syndrome (SIDS) Education, Perinatal Mood and Anxiety Disorder (PMADs) Education, Hospital Drug Screen Policy Information, Child Protective Service Report  , CSW Will Continue to Monitor Umbilical Cord Tissue Drug Screen Results and Make Report if Warranted    Jessi Pitstick, LCSW 06/05/2019, 11:46 AM  

## 2019-06-05 NOTE — Anesthesia Postprocedure Evaluation (Signed)
Anesthesia Post Note  Patient: Vickie Moss  Procedure(s) Performed: AN AD [place]     Patient location during evaluation: Mother Baby Anesthesia Type: Epidural Level of consciousness: awake Pain management: satisfactory to patient Vital Signs Assessment: post-procedure vital signs reviewed and stable Respiratory status: spontaneous breathing Cardiovascular status: stable Anesthetic complications: no    Last Vitals:  Vitals:   06/05/19 0150 06/05/19 0525  BP: 116/82 107/74  Pulse: 98 88  Resp: 18 18  Temp: (!) 36.1 C 36.7 C  SpO2: 99% 99%    Last Pain:  Vitals:   06/05/19 1140  TempSrc:   PainSc: 4    Pain Goal: Patients Stated Pain Goal: 3 (06/05/19 0949)                 Casimer Lanius

## 2019-06-12 ENCOUNTER — Telehealth: Payer: Medicaid Other

## 2019-07-10 ENCOUNTER — Telehealth: Payer: Medicaid Other | Admitting: Advanced Practice Midwife

## 2019-07-12 ENCOUNTER — Other Ambulatory Visit: Payer: Medicaid Other

## 2019-12-28 DIAGNOSIS — Z23 Encounter for immunization: Secondary | ICD-10-CM | POA: Diagnosis not present

## 2020-03-28 ENCOUNTER — Ambulatory Visit
Admission: EM | Admit: 2020-03-28 | Discharge: 2020-03-28 | Disposition: A | Discharge status: Home / Self Care | Payer: Medicaid Other | Attending: Emergency Medicine | Admitting: Emergency Medicine

## 2020-03-28 ENCOUNTER — Other Ambulatory Visit: Payer: Self-pay

## 2020-03-28 DIAGNOSIS — R1031 Right lower quadrant pain: Secondary | ICD-10-CM | POA: Diagnosis not present

## 2020-03-28 LAB — POCT URINE PREGNANCY: Preg Test, Ur: NEGATIVE

## 2020-03-28 LAB — POCT URINALYSIS DIP (MANUAL ENTRY)
Bilirubin, UA: NEGATIVE
Glucose, UA: NEGATIVE mg/dL
Ketones, POC UA: NEGATIVE mg/dL
Nitrite, UA: NEGATIVE
Protein Ur, POC: NEGATIVE mg/dL
Spec Grav, UA: <=1.005 — AB (ref 1.010–1.025)
Urobilinogen, UA: 0.2 E.U./dL
pH, UA: 6.5 (ref 5.0–8.0)

## 2020-03-28 MED ORDER — MELOXICAM 15 MG PO TABS: 15.0000 mg | ORAL_TABLET | Freq: Every day | ORAL | 0 refills | Status: AC

## 2020-03-28 MED ORDER — KETOROLAC TROMETHAMINE 60 MG/2ML IM SOLN
60.0000 mg | Freq: Once | INTRAMUSCULAR | Status: AC
  Administered 2020-03-28: 60 mg via INTRAMUSCULAR

## 2020-03-28 NOTE — ED Triage Notes (Signed)
Pt presents with complaints of abdominal cramping.

## 2020-03-28 NOTE — Discharge Instructions (Signed)
Unable to rule out appendicitis, TOA, ovarian torsion, ovarian cyst, diverticulitis, etc.. in urgent care setting.  Offered patient further evaluation and management in the ED.  Patient declines at this time and would like to try outpatient therapy first.  Aware of the risk associated with this decision including missed diagnosis, organ damage, organ failure, and/or death.  Patient aware and in agreement.     Rest and push fluids Toradol shot given in office Mobic prescribed for pain.   Urine culture sent.  We will call you with abnormal results Vaginal swab obtained.  We will follow up with you regarding abnormal results Go to ED if symptoms do not improve or worsen

## 2020-03-28 NOTE — ED Provider Notes (Signed)
[facility]   [id number] 03/28/20 Arrival Time: 1903  CC: ABDOMINAL DISCOMFORT  SUBJECTIVE:  Vickie Moss is a 34 y.o. female who presents with complaint of RLQ discomfort x 1 week.  Denies a precipitating event, or trauma.  Last sex over a month ago.  Would like STD testing.  Localizes pain to RLQ.  Describes as intermittent and cramping in character.  Has not tried OTC medications.  Denies alleviating or aggravating factors.  Denies similar symptoms in the past.    Denies fever, chills, nausea, vomiting, chest pain, SOB, diarrhea, constipation, hematochezia, melena, dysuria, difficulty urinating, increased frequency or urgency, flank pain, loss of bowel or bladder function, vaginal discharge, vaginal odor, vaginal bleeding, dyspareunia, pelvic pain.     LMP: "last month" not on birth control  ROS: As per HPI.  All other pertinent ROS negative.     Past Medical History:  Diagnosis Date  . Gestational diabetes    Past Surgical History:  Procedure Laterality Date  . NO PAST SURGERIES     No Known Allergies No current facility-administered medications on file prior to encounter.   Current Outpatient Medications on File Prior to Encounter  Medication Sig Dispense Refill  . Blood Pressure Monitor MISC For regular home bp monitoring during pregnancy 1 each 0  . ipratropium (ATROVENT) 0.06 % nasal spray Place 2 sprays into both nostrils 3 (three) times daily.     . Prenatal Vit-Fe Fumarate-FA (PRENATAL COMPLETE) 14-0.4 MG TABS Take 1 tablet by mouth daily. 30 each 0   Social History   Socioeconomic History  . Marital status: Single    Spouse name: Not on file  . Number of children: 0  . Years of education: Not on file  . Highest education level: Not on file  Occupational History  . Not on file  Tobacco Use  . Smoking status: Current Every Day Smoker    Packs/day: 1.00  . Smokeless tobacco: Never Used  Vaping Use  . Vaping Use: Never used  Substance and Sexual  Activity  . Alcohol use: Not Currently  . Drug use: Not Currently    Types: Cocaine    Comment: 08/06/2017  . Sexual activity: Yes    Birth control/protection: None  Other Topics Concern  . Not on file  Social History Narrative  . Not on file   Social Determinants of Health   Financial Resource Strain:   . Difficulty of Paying Living Expenses: Not on file  Food Insecurity:   . Worried About Programme researcher, broadcasting/film/video in the Last Year: Not on file  . Ran Out of Food in the Last Year: Not on file  Transportation Needs:   . Lack of Transportation (Medical): Not on file  . Lack of Transportation (Non-Medical): Not on file  Physical Activity:   . Days of Exercise per Week: Not on file  . Minutes of Exercise per Session: Not on file  Stress:   . Feeling of Stress : Not on file  Social Connections:   . Frequency of Communication with Friends and Family: Not on file  . Frequency of Social Gatherings with Friends and Family: Not on file  . Attends Religious Services: Not on file  . Active Member of Clubs or Organizations: Not on file  . Attends Banker Meetings: Not on file  . Marital Status: Not on file  Intimate Partner Violence:   . Fear of Current or Ex-Partner: Not on file  . Emotionally Abused: Not on  file  . Physically Abused: Not on file  . Sexually Abused: Not on file   Family History  Problem Relation Age of Onset  . Diabetes Maternal Grandfather      OBJECTIVE:  Vitals:   03/28/20 1917  BP: (!) 138/94  Pulse: (!) 114  Resp: 17  Temp: 99.1 F (37.3 C)  TempSrc: Oral  SpO2: 97%    General appearance: Alert; NAD HEENT: NCAT.  Oropharynx clear.  Lungs: clear to auscultation bilaterally without adventitious breath sounds Heart: regular rate and rhythm.  Abdomen: soft, non-distended; normal active bowel sounds; mildly TTP over RLQ to deep palpation; TTP at McBurney's point; negative Murphy's sign;  no guarding Extremities: no edema; symmetrical with no  gross deformities Skin: warm and dry Neurologic: normal gait Psychological: alert and cooperative; normal mood and affect  LABS: Results for orders placed or performed during the hospital encounter of 03/28/20 (from the past 24 hour(s))  POCT urine pregnancy     Status: None   Collection Time: 03/28/20  7:25 PM  Result Value Ref Range   Preg Test, Ur Negative Negative  POCT urinalysis dipstick     Status: Abnormal   Collection Time: 03/28/20  7:25 PM  Result Value Ref Range   Color, UA yellow yellow   Clarity, UA clear clear   Glucose, UA negative negative mg/dL   Bilirubin, UA negative negative   Ketones, POC UA negative negative mg/dL   Spec Grav, UA <=8.003 (A) 1.010 - 1.025   Blood, UA trace-intact (A) negative   pH, UA 6.5 5.0 - 8.0   Protein Ur, POC negative negative mg/dL   Urobilinogen, UA 0.2 0.2 or 1.0 E.U./dL   Nitrite, UA Negative Negative   Leukocytes, UA Trace (A) Negative    ASSESSMENT & PLAN:  1. RLQ discomfort     Meds ordered this encounter  Medications  . meloxicam (MOBIC) 15 MG tablet    Sig: Take 1 tablet (15 mg total) by mouth daily.    Dispense:  30 tablet    Refill:  0    Order Specific Question:   Supervising Provider    Answer:   Eustace Moore [[id number]]  . ketorolac (TORADOL) injection 60 mg    Unable to rule out appendicitis, TOA, ovarian torsion, ovarian cyst, diverticulitis, etc.. in urgent care setting.  Offered patient further evaluation and management in the ED.  Patient declines at this time and would like to try outpatient therapy first.  Aware of the risk associated with this decision including missed diagnosis, organ damage, organ failure, and/or death.  Patient aware and in agreement.     Rest and push fluids Toradol shot given in office Mobic prescribed for pain.   Urine culture sent.  We will call you with abnormal results Vaginal swab obtained.  We will follow up with you regarding abnormal results Go to ED if symptoms do  not improve or worsen  Reviewed expectations re: course of current medical issues. Questions answered. Outlined signs and symptoms indicating need for more acute intervention. Patient verbalized understanding. After Visit Summary given.   Rennis Harding, PA-C 03/28/20 1928

## 2020-03-31 LAB — URINE CULTURE: Culture: <10000 — AB

## 2020-04-01 ENCOUNTER — Telehealth (HOSPITAL_COMMUNITY): Payer: Self-pay | Admitting: Emergency Medicine

## 2020-04-01 LAB — CERVICOVAGINAL ANCILLARY ONLY
Bacterial Vaginitis (gardnerella): POSITIVE — AB
Candida Glabrata: NEGATIVE
Candida Vaginitis: POSITIVE — AB
Chlamydia: NEGATIVE
Comment: NEGATIVE
Comment: NEGATIVE
Comment: NEGATIVE
Comment: NEGATIVE
Comment: NEGATIVE
Comment: NORMAL
Neisseria Gonorrhea: POSITIVE — AB
Trichomonas: POSITIVE — AB

## 2020-04-01 MED ORDER — FLUCONAZOLE 150 MG PO TABS: 150.0000 mg | ORAL_TABLET | Freq: Once | ORAL | 0 refills | Status: AC

## 2020-04-01 MED ORDER — METRONIDAZOLE 500 MG PO TABS: 500.0000 mg | ORAL_TABLET | Freq: Two times a day (BID) | ORAL | 0 refills | Status: AC

## 2020-12-20 ENCOUNTER — Encounter: Payer: Self-pay | Admitting: Emergency Medicine

## 2020-12-20 ENCOUNTER — Ambulatory Visit
Admission: EM | Admit: 2020-12-20 | Discharge: 2020-12-20 | Disposition: A | Discharge status: Home / Self Care | Payer: Medicaid Other | Attending: Emergency Medicine | Admitting: Emergency Medicine

## 2020-12-20 ENCOUNTER — Other Ambulatory Visit: Payer: Self-pay

## 2020-12-20 DIAGNOSIS — R21 Rash and other nonspecific skin eruption: Secondary | ICD-10-CM | POA: Diagnosis not present

## 2020-12-20 MED ORDER — TRIAMCINOLONE ACETONIDE 0.1 % EX CREA: 1.0000 "application " | TOPICAL_CREAM | Freq: Two times a day (BID) | CUTANEOUS | 0 refills | Status: AC

## 2020-12-20 NOTE — Discharge Instructions (Addendum)
Triamcinolone prescribed.  Use as directed and to completion Use calamine lotion, oatmeal baths, zyrtec/ allegra/ claritin for itching Follow up with PCP if symptoms persists Return or go to the ER if you have any new or worsening symptoms such as fever, chills, nausea, vomiting, redness, swelling, discharge, if symptoms do not improve with medications, etc..Marland Kitchen

## 2020-12-20 NOTE — ED Triage Notes (Signed)
Rash on back and arms that is itchy that started yesterday

## 2020-12-20 NOTE — ED Provider Notes (Signed)
[facility]   [id number] 12/20/20 Arrival Time: 1135  CC: rash   SUBJECTIVE:  Vickie Moss is a 35 y.o. female who presents with a rash that began 1 day ago.  Denies precipitating event or trauma.  Denies changes in soaps, detergents, close contacts with similar rash, known trigger or environmental trigger, allergy. Denies medications change or starting a new medication recently.  Localizes the rash to back, neck, and face.  Describes it as red and itchy.  Has tried benadryl without relief.  Symptoms are made worse with scratching.  Denies similar symptoms in the past.   Denies fever, chills, nausea, vomiting,  swelling, discharge.   ROS: As per HPI.  All other pertinent ROS negative.     Past Medical History:  Diagnosis Date   Gestational diabetes    Past Surgical History:  Procedure Laterality Date   NO PAST SURGERIES     No Known Allergies No current facility-administered medications on file prior to encounter.   Current Outpatient Medications on File Prior to Encounter  Medication Sig Dispense Refill   Blood Pressure Monitor MISC For regular home bp monitoring during pregnancy 1 each 0   ipratropium (ATROVENT) 0.06 % nasal spray Place 2 sprays into both nostrils 3 (three) times daily.      meloxicam (MOBIC) 15 MG tablet Take 1 tablet (15 mg total) by mouth daily. 30 tablet 0   metroNIDAZOLE (FLAGYL) 500 MG tablet Take 1 tablet (500 mg total) by mouth 2 (two) times daily. 14 tablet 0   Prenatal Vit-Fe Fumarate-FA (PRENATAL COMPLETE) 14-0.4 MG TABS Take 1 tablet by mouth daily. 30 each 0   Social History   Socioeconomic History   Marital status: Single    Spouse name: Not on file   Number of children: 0   Years of education: Not on file   Highest education level: Not on file  Occupational History   Not on file  Tobacco Use   Smoking status: Every Day    Packs/day: 1.00    Pack years: 0.00    Types: Cigarettes   Smokeless tobacco: Never  Vaping Use    Vaping Use: Never used  Substance and Sexual Activity   Alcohol use: Not Currently   Drug use: Not Currently    Types: Cocaine    Comment: 08/06/2017   Sexual activity: Yes    Birth control/protection: None  Other Topics Concern   Not on file  Social History Narrative   Not on file   Social Determinants of Health   Financial Resource Strain: Not on file  Food Insecurity: Not on file  Transportation Needs: Not on file  Physical Activity: Not on file  Stress: Not on file  Social Connections: Not on file  Intimate Partner Violence: Not on file   Family History  Problem Relation Age of Onset   Diabetes Maternal Grandfather     OBJECTIVE: Vitals:   12/20/20 1304  BP: 130/90  Pulse: (!) 103  Resp: 18  Temp: 98.3 F (36.8 C)  TempSrc: Oral  SpO2: 99%    General appearance: alert; no distress Head: NCAT Lungs: normal respiratory effort Extremities: no edema Skin: warm and dry; erythematous papular rash to neck, and face, NTTP, no obvious draiange or bleeding Psychological: alert and cooperative; normal mood and affect  ASSESSMENT & PLAN:  1. Rash and nonspecific skin eruption     Meds ordered this encounter  Medications   triamcinolone cream (KENALOG) 0.1 %    Sig:  Apply 1 application topically 2 (two) times daily.    Dispense:  80 g    Refill:  0    Order Specific Question:   Supervising Provider    Answer:   Eustace Moore [[id number]]    @[facility] @  Triamcinolone prescribed.  Use as directed and to completion Use calamine lotion, oatmeal baths, zyrtec/ allegra/ claritin for itching Follow up with PCP if symptoms persists Return or go to the ER if you have any new or worsening symptoms such as fever, chills, nausea, vomiting, redness, swelling, discharge, if symptoms do not improve with medications, etc...  Reviewed expectations re: course of current medical issues. Questions answered. Outlined signs and symptoms indicating need for more acute  intervention. Patient verbalized understanding. After Visit Summary given.    , PA-C 12/20/20 1316

## 2022-03-18 NOTE — Progress Notes (Signed)
No chief complaint on file.  A user error has taken place: encounter opened in error, closed for administrative reasons.
